# Patient Record
Sex: Female | Born: 1970 | Race: White | Hispanic: No | Marital: Married | Smoking: Former smoker
Health system: Southern US, Community
[De-identification: ages and names within clinical notes are randomized; demographics above are authoritative.]

## PROBLEM LIST (undated history)

## (undated) DIAGNOSIS — T8859XA Other complications of anesthesia, initial encounter: Secondary | ICD-10-CM

## (undated) DIAGNOSIS — F32A Depression, unspecified: Secondary | ICD-10-CM

## (undated) DIAGNOSIS — F419 Anxiety disorder, unspecified: Secondary | ICD-10-CM

## (undated) DIAGNOSIS — C50919 Malignant neoplasm of unspecified site of unspecified female breast: Secondary | ICD-10-CM

## (undated) DIAGNOSIS — J45909 Unspecified asthma, uncomplicated: Secondary | ICD-10-CM

## (undated) DIAGNOSIS — Z9889 Other specified postprocedural states: Secondary | ICD-10-CM

## (undated) DIAGNOSIS — E079 Disorder of thyroid, unspecified: Secondary | ICD-10-CM

## (undated) HISTORY — DX: Anxiety disorder, unspecified: F41.9

## (undated) HISTORY — DX: Unspecified asthma, uncomplicated: J45.909

## (undated) HISTORY — PX: ECTOPIC PREGNANCY SURGERY: SHX613

## (undated) HISTORY — DX: Depression, unspecified: F32.A

---

## 1998-02-07 ENCOUNTER — Other Ambulatory Visit: Admission: RE | Admit: 1998-02-07 | Discharge: 1998-02-07 | Payer: Self-pay | Admitting: Obstetrics and Gynecology

## 1999-02-12 ENCOUNTER — Other Ambulatory Visit: Admission: RE | Admit: 1999-02-12 | Discharge: 1999-02-12 | Payer: Self-pay | Admitting: *Deleted

## 1999-05-03 ENCOUNTER — Inpatient Hospital Stay (HOSPITAL_COMMUNITY): Admission: AD | Admit: 1999-05-03 | Discharge: 1999-05-03 | Payer: Self-pay | Admitting: Obstetrics & Gynecology

## 1999-05-09 ENCOUNTER — Inpatient Hospital Stay (HOSPITAL_COMMUNITY): Admission: AD | Admit: 1999-05-09 | Discharge: 1999-05-09 | Payer: Self-pay | Admitting: Obstetrics and Gynecology

## 1999-05-28 ENCOUNTER — Observation Stay (HOSPITAL_COMMUNITY): Admission: AD | Admit: 1999-05-28 | Discharge: 1999-05-29 | Payer: Self-pay | Admitting: *Deleted

## 2000-03-19 ENCOUNTER — Other Ambulatory Visit: Admission: RE | Admit: 2000-03-19 | Discharge: 2000-03-19 | Payer: Self-pay | Admitting: Obstetrics & Gynecology

## 2004-05-14 ENCOUNTER — Ambulatory Visit (HOSPITAL_COMMUNITY): Admission: RE | Admit: 2004-05-14 | Discharge: 2004-05-14 | Payer: Self-pay | Admitting: Obstetrics and Gynecology

## 2007-09-24 HISTORY — PX: CHOLECYSTECTOMY: SHX55

## 2015-02-15 ENCOUNTER — Other Ambulatory Visit: Payer: Self-pay

## 2015-02-15 DIAGNOSIS — Z1231 Encounter for screening mammogram for malignant neoplasm of breast: Secondary | ICD-10-CM

## 2015-02-22 ENCOUNTER — Ambulatory Visit
Admission: RE | Admit: 2015-02-22 | Discharge: 2015-02-22 | Disposition: A | Discharge status: Home / Self Care | Payer: PRIVATE HEALTH INSURANCE | Source: Ambulatory Visit

## 2015-02-22 DIAGNOSIS — Z1231 Encounter for screening mammogram for malignant neoplasm of breast: Secondary | ICD-10-CM

## 2015-06-22 ENCOUNTER — Ambulatory Visit (INDEPENDENT_AMBULATORY_CARE_PROVIDER_SITE_OTHER): Payer: 59 | Admitting: Allergy and Immunology

## 2015-06-22 ENCOUNTER — Encounter (INDEPENDENT_AMBULATORY_CARE_PROVIDER_SITE_OTHER): Payer: Self-pay

## 2015-06-22 ENCOUNTER — Encounter: Payer: Self-pay | Admitting: Allergy and Immunology

## 2015-06-22 VITALS — BP 108/80 | HR 88 | Resp 24 | Ht 66.0 in | Wt 186.1 lb

## 2015-06-22 DIAGNOSIS — J3089 Other allergic rhinitis: Secondary | ICD-10-CM | POA: Diagnosis not present

## 2015-06-22 DIAGNOSIS — K219 Gastro-esophageal reflux disease without esophagitis: Secondary | ICD-10-CM | POA: Insufficient documentation

## 2015-06-22 DIAGNOSIS — J387 Other diseases of larynx: Secondary | ICD-10-CM | POA: Diagnosis not present

## 2015-06-22 DIAGNOSIS — J4540 Moderate persistent asthma, uncomplicated: Secondary | ICD-10-CM

## 2015-06-22 MED ORDER — AZELASTINE-FLUTICASONE 137-50 MCG/ACT NA SUSP: 1.0000 | Freq: Two times a day (BID) | NASAL | Status: DC

## 2015-06-22 NOTE — Patient Instructions (Addendum)
  1. Start immunotherapy  2. Script for Dymista - one spray each nostril twice a day  3. Continue all other medications  4. Get a flu vaccine  5. Return in six months.

## 2015-06-22 NOTE — Progress Notes (Signed)
Subjective  Kristina [name] is a 44 y.o. female who [facility] of the following:  Problem  Moderate Persistent Asthma   She has been doing very well with her asthma without a requirement for a SABA or the need to get a steroid. However she did have a flare of rhinitis this month but once again had stable asthma status during this rhinitic flare. She continues on Asmanex consistently.   Other Allergic Rhinitis   Was doing very well until two weeks ago at which time she developed sneezing, nasal congestion, and watery eyes requiring her to go to the urgent care center and get a kenalog injection. She is much better today. This episode occurred wjile using her montelukast and patanase daily. She was given a sample of Dymista by the UC.    Laryngopharyngeal Reflux (Lpr)   While using a combination of omeprazole in the morning and ranitidine in the evening she has not had any reflux symptoms and she has not had any throat problems     Past Medical History  Diagnosis Date  . Asthma     Past Surgical History  Procedure Laterality Date  . Cholecystectomy  2009  . Ectopic pregnancy surgery      Current Outpatient Prescriptions on File Prior to Visit  Medication Sig Dispense Refill  . Albuterol Sulfate (PROAIR HFA IN) Inhale 2 puffs into the lungs every 4 (four) hours as needed.    Mack Guise THYROID PO Take 1 tablet by mouth daily.    . Ferrous Sulfate (IRON SUPPLEMENT PO) Take 1 tablet by mouth daily.    . mometasone (ASMANEX) 220 MCG/INH inhaler Inhale 2 puffs into the lungs daily.    . montelukast (SINGULAIR) 10 MG tablet Take 10 mg by mouth at bedtime.    . Olopatadine HCl (PATANASE) 0.6 % SOLN Place 1 puff into the nose 2 (two) times daily.    Marland Kitchen omeprazole (PRILOSEC) 40 MG capsule Take 40 mg by mouth daily.    . ranitidine (ZANTAC) 300 MG capsule Take 300 mg by mouth every evening.     No current facility-administered medications on file  prior to visit.    Meds ordered this encounter  Medications  . DISCONTD: Azelastine-Fluticasone (DYMISTA) 137-50 MCG/ACT SUSP    Sig: Place 1 puff into the nose 2 (two) times daily.  . Azelastine-Fluticasone (DYMISTA) 137-50 MCG/ACT SUSP    Sig: Place 1 puff into the nose 2 (two) times daily.    Dispense:  1 Bottle    Refill:  5      No Known Allergies  Review of Systems  Constitutional: Negative for fever and chills.  HENT: Negative for congestion, ear pain, hearing loss, nosebleeds, sore throat and tinnitus.   Eyes: Negative for redness.  Respiratory: Negative for cough, sputum production, shortness of breath and wheezing.   Cardiovascular: Negative for leg swelling.  Gastrointestinal: Negative for heartburn, nausea, vomiting and abdominal pain.  Skin: Negative for itching and rash.  Neurological: Negative for headaches.     Objective:   Filed Vitals:   06/22/15 1600  BP: 108/80  Pulse: 88  Resp: 24    Physical Exam  Constitutional: She is oriented to person, place, and time and well-developed, well-nourished, and in no distress.  HENT:  Right Ear: External ear normal.  Left Ear: External ear normal.  Nose: Nose normal.  Mouth/Throat: Oropharynx is clear and moist.  Eyes: Conjunctivae are normal.  Neck: No JVD  present. No tracheal deviation present. No thyromegaly present.  Cardiovascular: Normal rate, regular rhythm and normal heart sounds.  Exam reveals no gallop and no friction rub.   No murmur heard. Pulmonary/Chest: No stridor. No respiratory distress. She has no wheezes. She has no rales. She exhibits no tenderness.  Musculoskeletal: She exhibits no edema.  Lymphadenopathy:    She has no cervical adenopathy.  Neurological: She is alert and oriented to person, place, and time.  Skin: No rash noted. No erythema. No pallor.    Diagnostics:    Spirometry was performed and demonstrated an FEV1 of 2.75 @ 96% predicted.  The patient had an Asthma Control  Test with the following results: ACT Total Score: 24.    Assessment and Plan:     Problem List Items Addressed This Visit      Respiratory   Moderate persistent asthma - Primary   Relevant Orders   Spirometry with Graph   Other allergic rhinitis   Laryngopharyngeal reflux (LPR)     1. Start immunotherapy  2. Script for Dymista - one spray each nostril twice a day  3. Continue all other medications  4. Get a flu vaccine  5. Return in six months.

## 2015-06-28 DIAGNOSIS — J3081 Allergic rhinitis due to animal (cat) (dog) hair and dander: Secondary | ICD-10-CM | POA: Diagnosis not present

## 2015-06-29 DIAGNOSIS — J301 Allergic rhinitis due to pollen: Secondary | ICD-10-CM | POA: Diagnosis not present

## 2015-06-30 ENCOUNTER — Other Ambulatory Visit: Payer: Self-pay | Admitting: Allergy and Immunology

## 2015-06-30 DIAGNOSIS — J3089 Other allergic rhinitis: Secondary | ICD-10-CM | POA: Diagnosis not present

## 2015-06-30 DIAGNOSIS — J302 Other seasonal allergic rhinitis: Principal | ICD-10-CM

## 2015-06-30 DIAGNOSIS — H1010 Acute atopic conjunctivitis, unspecified eye: Secondary | ICD-10-CM

## 2015-07-06 ENCOUNTER — Other Ambulatory Visit: Payer: Self-pay | Admitting: *Deleted

## 2015-07-06 ENCOUNTER — Ambulatory Visit (INDEPENDENT_AMBULATORY_CARE_PROVIDER_SITE_OTHER): Payer: 59 | Admitting: *Deleted

## 2015-07-06 DIAGNOSIS — J309 Allergic rhinitis, unspecified: Secondary | ICD-10-CM

## 2015-07-06 MED ORDER — EPINEPHRINE 0.3 MG/0.3ML IJ SOAJ: 0.3000 mg | Freq: Once | INTRAMUSCULAR | Status: DC

## 2015-07-06 NOTE — Progress Notes (Signed)
Pt started inj - Grass/Tree - Dust Mite/Weed - Cat/Dog - Blue 1:100,000 at 0.05 schedule B 1-2 times weekly during build-up. Instructions/education on possible reactions reviewed. Consent signed. Epipen instructions given and RX sent to pharmacy.

## 2015-07-10 ENCOUNTER — Ambulatory Visit (INDEPENDENT_AMBULATORY_CARE_PROVIDER_SITE_OTHER): Payer: 59 | Admitting: *Deleted

## 2015-07-10 DIAGNOSIS — J309 Allergic rhinitis, unspecified: Secondary | ICD-10-CM

## 2015-07-13 ENCOUNTER — Ambulatory Visit (INDEPENDENT_AMBULATORY_CARE_PROVIDER_SITE_OTHER): Payer: 59 | Admitting: *Deleted

## 2015-07-13 DIAGNOSIS — J309 Allergic rhinitis, unspecified: Secondary | ICD-10-CM | POA: Diagnosis not present

## 2015-07-17 ENCOUNTER — Ambulatory Visit (INDEPENDENT_AMBULATORY_CARE_PROVIDER_SITE_OTHER): Payer: 59 | Admitting: *Deleted

## 2015-07-17 DIAGNOSIS — J3089 Other allergic rhinitis: Secondary | ICD-10-CM | POA: Diagnosis not present

## 2015-07-21 ENCOUNTER — Ambulatory Visit (INDEPENDENT_AMBULATORY_CARE_PROVIDER_SITE_OTHER): Payer: 59 | Admitting: *Deleted

## 2015-07-21 DIAGNOSIS — J309 Allergic rhinitis, unspecified: Secondary | ICD-10-CM

## 2015-07-24 ENCOUNTER — Ambulatory Visit (INDEPENDENT_AMBULATORY_CARE_PROVIDER_SITE_OTHER): Payer: 59 | Admitting: *Deleted

## 2015-07-24 DIAGNOSIS — J309 Allergic rhinitis, unspecified: Secondary | ICD-10-CM | POA: Diagnosis not present

## 2015-07-27 ENCOUNTER — Ambulatory Visit (INDEPENDENT_AMBULATORY_CARE_PROVIDER_SITE_OTHER): Payer: 59 | Admitting: *Deleted

## 2015-07-27 DIAGNOSIS — J309 Allergic rhinitis, unspecified: Secondary | ICD-10-CM

## 2015-07-31 ENCOUNTER — Ambulatory Visit (INDEPENDENT_AMBULATORY_CARE_PROVIDER_SITE_OTHER): Payer: 59

## 2015-07-31 DIAGNOSIS — J3089 Other allergic rhinitis: Secondary | ICD-10-CM

## 2015-08-03 ENCOUNTER — Ambulatory Visit (INDEPENDENT_AMBULATORY_CARE_PROVIDER_SITE_OTHER): Payer: 59

## 2015-08-03 DIAGNOSIS — J309 Allergic rhinitis, unspecified: Secondary | ICD-10-CM | POA: Diagnosis not present

## 2015-08-07 ENCOUNTER — Ambulatory Visit (INDEPENDENT_AMBULATORY_CARE_PROVIDER_SITE_OTHER): Payer: 59 | Admitting: *Deleted

## 2015-08-07 DIAGNOSIS — J309 Allergic rhinitis, unspecified: Secondary | ICD-10-CM

## 2015-08-10 ENCOUNTER — Ambulatory Visit (INDEPENDENT_AMBULATORY_CARE_PROVIDER_SITE_OTHER): Payer: 59

## 2015-08-10 DIAGNOSIS — J3089 Other allergic rhinitis: Secondary | ICD-10-CM | POA: Diagnosis not present

## 2015-08-14 ENCOUNTER — Ambulatory Visit (INDEPENDENT_AMBULATORY_CARE_PROVIDER_SITE_OTHER): Payer: 59 | Admitting: *Deleted

## 2015-08-14 DIAGNOSIS — J309 Allergic rhinitis, unspecified: Secondary | ICD-10-CM

## 2015-08-21 ENCOUNTER — Ambulatory Visit (INDEPENDENT_AMBULATORY_CARE_PROVIDER_SITE_OTHER): Payer: 59

## 2015-08-21 DIAGNOSIS — J309 Allergic rhinitis, unspecified: Secondary | ICD-10-CM

## 2015-08-24 ENCOUNTER — Ambulatory Visit (INDEPENDENT_AMBULATORY_CARE_PROVIDER_SITE_OTHER): Payer: 59 | Admitting: *Deleted

## 2015-08-24 DIAGNOSIS — J309 Allergic rhinitis, unspecified: Secondary | ICD-10-CM | POA: Diagnosis not present

## 2015-08-28 ENCOUNTER — Ambulatory Visit (INDEPENDENT_AMBULATORY_CARE_PROVIDER_SITE_OTHER): Payer: 59 | Admitting: *Deleted

## 2015-08-28 DIAGNOSIS — J309 Allergic rhinitis, unspecified: Secondary | ICD-10-CM

## 2015-08-31 ENCOUNTER — Ambulatory Visit (INDEPENDENT_AMBULATORY_CARE_PROVIDER_SITE_OTHER): Payer: 59 | Admitting: *Deleted

## 2015-08-31 DIAGNOSIS — J309 Allergic rhinitis, unspecified: Secondary | ICD-10-CM | POA: Diagnosis not present

## 2015-09-04 ENCOUNTER — Ambulatory Visit (INDEPENDENT_AMBULATORY_CARE_PROVIDER_SITE_OTHER): Payer: 59 | Admitting: *Deleted

## 2015-09-04 DIAGNOSIS — J309 Allergic rhinitis, unspecified: Secondary | ICD-10-CM

## 2015-09-08 ENCOUNTER — Ambulatory Visit (INDEPENDENT_AMBULATORY_CARE_PROVIDER_SITE_OTHER): Payer: 59 | Admitting: *Deleted

## 2015-09-08 DIAGNOSIS — J309 Allergic rhinitis, unspecified: Secondary | ICD-10-CM

## 2015-09-11 ENCOUNTER — Ambulatory Visit (INDEPENDENT_AMBULATORY_CARE_PROVIDER_SITE_OTHER): Payer: 59 | Admitting: *Deleted

## 2015-09-11 DIAGNOSIS — J309 Allergic rhinitis, unspecified: Secondary | ICD-10-CM

## 2015-09-28 ENCOUNTER — Ambulatory Visit (INDEPENDENT_AMBULATORY_CARE_PROVIDER_SITE_OTHER): Payer: 59 | Admitting: *Deleted

## 2015-09-28 DIAGNOSIS — J309 Allergic rhinitis, unspecified: Secondary | ICD-10-CM | POA: Diagnosis not present

## 2015-10-05 ENCOUNTER — Ambulatory Visit (INDEPENDENT_AMBULATORY_CARE_PROVIDER_SITE_OTHER): Payer: 59 | Admitting: *Deleted

## 2015-10-05 DIAGNOSIS — J309 Allergic rhinitis, unspecified: Secondary | ICD-10-CM | POA: Diagnosis not present

## 2015-10-12 ENCOUNTER — Ambulatory Visit (INDEPENDENT_AMBULATORY_CARE_PROVIDER_SITE_OTHER): Payer: 59

## 2015-10-12 DIAGNOSIS — J309 Allergic rhinitis, unspecified: Secondary | ICD-10-CM

## 2015-10-19 ENCOUNTER — Ambulatory Visit (INDEPENDENT_AMBULATORY_CARE_PROVIDER_SITE_OTHER): Payer: 59

## 2015-10-19 DIAGNOSIS — J309 Allergic rhinitis, unspecified: Secondary | ICD-10-CM

## 2015-10-26 ENCOUNTER — Ambulatory Visit (INDEPENDENT_AMBULATORY_CARE_PROVIDER_SITE_OTHER): Payer: 59

## 2015-10-26 DIAGNOSIS — J309 Allergic rhinitis, unspecified: Secondary | ICD-10-CM | POA: Diagnosis not present

## 2015-11-09 ENCOUNTER — Ambulatory Visit (INDEPENDENT_AMBULATORY_CARE_PROVIDER_SITE_OTHER): Payer: Managed Care, Other (non HMO) | Admitting: *Deleted

## 2015-11-09 DIAGNOSIS — J309 Allergic rhinitis, unspecified: Secondary | ICD-10-CM

## 2015-11-14 DIAGNOSIS — J301 Allergic rhinitis due to pollen: Secondary | ICD-10-CM | POA: Diagnosis not present

## 2015-11-15 DIAGNOSIS — J3081 Allergic rhinitis due to animal (cat) (dog) hair and dander: Secondary | ICD-10-CM | POA: Diagnosis not present

## 2015-11-16 ENCOUNTER — Other Ambulatory Visit: Payer: Self-pay | Admitting: Allergy and Immunology

## 2015-11-17 ENCOUNTER — Ambulatory Visit (INDEPENDENT_AMBULATORY_CARE_PROVIDER_SITE_OTHER): Payer: PRIVATE HEALTH INSURANCE | Admitting: *Deleted

## 2015-11-17 DIAGNOSIS — J309 Allergic rhinitis, unspecified: Secondary | ICD-10-CM

## 2015-11-23 ENCOUNTER — Ambulatory Visit (INDEPENDENT_AMBULATORY_CARE_PROVIDER_SITE_OTHER): Payer: Managed Care, Other (non HMO) | Admitting: *Deleted

## 2015-11-23 DIAGNOSIS — J309 Allergic rhinitis, unspecified: Secondary | ICD-10-CM | POA: Diagnosis not present

## 2015-11-30 ENCOUNTER — Ambulatory Visit (INDEPENDENT_AMBULATORY_CARE_PROVIDER_SITE_OTHER): Payer: Managed Care, Other (non HMO) | Admitting: *Deleted

## 2015-11-30 DIAGNOSIS — J309 Allergic rhinitis, unspecified: Secondary | ICD-10-CM | POA: Diagnosis not present

## 2015-12-08 ENCOUNTER — Ambulatory Visit (INDEPENDENT_AMBULATORY_CARE_PROVIDER_SITE_OTHER): Payer: Managed Care, Other (non HMO) | Admitting: *Deleted

## 2015-12-08 DIAGNOSIS — J309 Allergic rhinitis, unspecified: Secondary | ICD-10-CM

## 2015-12-15 ENCOUNTER — Ambulatory Visit (INDEPENDENT_AMBULATORY_CARE_PROVIDER_SITE_OTHER): Payer: Managed Care, Other (non HMO) | Admitting: *Deleted

## 2015-12-15 DIAGNOSIS — J309 Allergic rhinitis, unspecified: Secondary | ICD-10-CM | POA: Diagnosis not present

## 2015-12-20 ENCOUNTER — Ambulatory Visit: Payer: 59 | Admitting: Allergy and Immunology

## 2015-12-22 ENCOUNTER — Ambulatory Visit (INDEPENDENT_AMBULATORY_CARE_PROVIDER_SITE_OTHER): Payer: Managed Care, Other (non HMO) | Admitting: *Deleted

## 2015-12-22 DIAGNOSIS — J309 Allergic rhinitis, unspecified: Secondary | ICD-10-CM | POA: Diagnosis not present

## 2015-12-28 ENCOUNTER — Ambulatory Visit (INDEPENDENT_AMBULATORY_CARE_PROVIDER_SITE_OTHER): Payer: Managed Care, Other (non HMO) | Admitting: *Deleted

## 2015-12-28 DIAGNOSIS — J309 Allergic rhinitis, unspecified: Secondary | ICD-10-CM | POA: Diagnosis not present

## 2016-01-04 ENCOUNTER — Encounter: Payer: Self-pay | Admitting: Allergy and Immunology

## 2016-01-04 ENCOUNTER — Ambulatory Visit (INDEPENDENT_AMBULATORY_CARE_PROVIDER_SITE_OTHER): Payer: Managed Care, Other (non HMO) | Admitting: Allergy and Immunology

## 2016-01-04 VITALS — BP 128/80 | HR 80 | Resp 18

## 2016-01-04 DIAGNOSIS — J4540 Moderate persistent asthma, uncomplicated: Secondary | ICD-10-CM | POA: Diagnosis not present

## 2016-01-04 DIAGNOSIS — J309 Allergic rhinitis, unspecified: Secondary | ICD-10-CM

## 2016-01-04 DIAGNOSIS — J387 Other diseases of larynx: Secondary | ICD-10-CM

## 2016-01-04 DIAGNOSIS — J3089 Other allergic rhinitis: Secondary | ICD-10-CM

## 2016-01-04 DIAGNOSIS — K219 Gastro-esophageal reflux disease without esophagitis: Secondary | ICD-10-CM

## 2016-01-04 MED ORDER — MOMETASONE FUROATE 220 MCG/INH IN AEPB
INHALATION_SPRAY | RESPIRATORY_TRACT | Status: DC
Start: 2016-01-04 — End: 2016-01-04

## 2016-01-04 MED ORDER — MOMETASONE FUROATE 220 MCG/INH IN AEPB: INHALATION_SPRAY | RESPIRATORY_TRACT | Status: DC

## 2016-01-04 MED ORDER — MONTELUKAST SODIUM 10 MG PO TABS: 10.0000 mg | ORAL_TABLET | Freq: Every day | ORAL | Status: DC

## 2016-01-04 MED ORDER — FLUTICASONE PROPIONATE 50 MCG/ACT NA SUSP: NASAL | Status: DC

## 2016-01-04 MED ORDER — AZELASTINE HCL 0.1 % NA SOLN: NASAL | Status: DC

## 2016-01-04 MED ORDER — AZELASTINE-FLUTICASONE 137-50 MCG/ACT NA SUSP
1.0000 | Freq: Two times a day (BID) | NASAL | Status: DC
Start: 2016-01-04 — End: 2016-01-04

## 2016-01-04 NOTE — Patient Instructions (Signed)
  1. Continue immunotherapy and EpiPen  2. Treat and prevent inflammation:   A. Asmanex 220 - one inhalation 1 time per day.  B. montelukast 10 mg - one tablet once a day  C. Dymista one spray each nostril twice a day  3. Continue omeprazole 40 mg in the morning  4. Can add ranitidine 300 mg in the evening if needed  5. Can use ProAir HFA 2 puffs every 4-6 hours if needed  6. Can add OTC antihistamine if needed  7. "Action plan" for asthma flare up: Increase Asmanex 220 to 2 inhalations twice a day and use ProAir HFA if needed  8. Return to clinic in 6 months or earlier if problem

## 2016-01-04 NOTE — Progress Notes (Signed)
Follow-up Note  Referring Provider: Philemon [name], Caroline, MD Primary Provider: Philemon [name],CAROLINE, MD Date of Office Visit: 01/04/2016  Subjective:   Kristina [name] (DOB: 02/19/1971) is a 45 y.o. female who [facility] in re-evaluation of the following:  HPI Comments: Kristina HerterShannon returns to this clinic in reevaluation of her moderate persistent asthma, allergic rhinitis, and LPR. She's been doing quite well but she developed a little bit of cough since the spring has arrived for which she restarted her Asmanex at a dose of one inhalation 1 time per day and has had improvement. She does not use her bronchodilator more than twice a week at this point. She is not had any problems with her nose while using her immunotherapy and montelukast and nasal steroid and antihistamine. She's not been having any problems with reflux while using her omeprazole in the morning and she no longer uses any ranitidine. He did not obtain the flu vaccine this fall.     Medication List           ARMOUR THYROID PO  Take 1 tablet by mouth daily.     ASMANEX 60 METERED DOSES 220 MCG/INH inhaler  Generic drug:  mometasone  INHALE TWO PUFFS BY MOUTH ONCE DAILY TO PREVENT COUGH OR WHEEZE.  RINSE, GARGLE, AND SPIT AFTER USE.     Azelastine-Fluticasone 137-50 MCG/ACT Susp  Commonly known as:  DYMISTA  Place 1 puff into the nose 2 (two) times daily.     EPINEPHrine 0.3 mg/0.3 mL Soaj injection  Commonly known as:  EPIPEN 2-PAK  Inject 0.3 mLs (0.3 mg total) into the muscle once.     IRON SUPPLEMENT PO  Take 1 tablet by mouth daily. Reported on 01/04/2016     montelukast 10 MG tablet  Commonly known as:  SINGULAIR  Take 10 mg by mouth at bedtime. Reported on 01/04/2016     omeprazole 40 MG capsule  Commonly known as:  PRILOSEC  Take 40 mg by mouth as needed.     PATANASE 0.6 % Soln  Generic drug:  Olopatadine HCl  Place 1 puff into the nose 2 (two) times daily.  Reported on 01/04/2016     PROAIR HFA IN  Inhale 2 puffs into the lungs every 4 (four) hours as needed.     ranitidine 300 MG capsule  Commonly known as:  ZANTAC  Take 300 mg by mouth every evening. Reported on 01/04/2016        Past Medical History  Diagnosis Date  . Asthma     Past Surgical History  Procedure Laterality Date  . Cholecystectomy  2009  . Ectopic pregnancy surgery      No Known Allergies  Review of systems negative except as noted in HPI / PMHx or noted below:  Review of Systems  Constitutional: Negative.   HENT: Negative.   Eyes: Negative.   Respiratory: Negative.   Cardiovascular: Negative.   Gastrointestinal: Negative.   Genitourinary: Negative.   Musculoskeletal: Negative.   Skin: Negative.   Neurological: Negative.   Endo/Heme/Allergies: Negative.   Psychiatric/Behavioral: Negative.      Objective:   Filed Vitals:   01/04/16 1020  BP: 128/80  Pulse: 80  Resp: 18          Physical Exam  Constitutional: She is well-developed, well-nourished, and in no distress.  HENT:  Head: Normocephalic.  Right Ear: Tympanic membrane, external ear and ear canal normal.  Left Ear: Tympanic membrane,  external ear and ear canal normal.  Nose: Nose normal. No mucosal edema or rhinorrhea.  Mouth/Throat: Uvula is midline, oropharynx is clear and moist and mucous membranes are normal. No oropharyngeal exudate.  Eyes: Conjunctivae are normal.  Neck: Trachea normal. No tracheal tenderness present. No tracheal deviation present. No thyromegaly present.  Cardiovascular: Normal rate, regular rhythm, S1 normal, S2 normal and normal heart sounds.   No murmur heard. Pulmonary/Chest: Breath sounds normal. No stridor. No respiratory distress. She has no wheezes. She has no rales.  Musculoskeletal: She exhibits no edema.  Lymphadenopathy:       Head (right side): No tonsillar adenopathy present.       Head (left side): No tonsillar adenopathy present.    She has  no cervical adenopathy.  Neurological: She is alert. Gait normal.  Skin: No rash noted. She is not diaphoretic. No erythema. Nails show no clubbing.  Psychiatric: Mood and affect normal.    Diagnostics:    Spirometry was performed and demonstrated an FEV1 of 2.53 at 82 % of predicted.  The patient had an Asthma Control Test with the following results:  .    Assessment and Plan:   1. Allergic rhinitis, unspecified allergic rhinitis type   2. Moderate persistent asthma, uncomplicated   3. Laryngopharyngeal reflux (LPR)   4. Other allergic rhinitis     1. Continue immunotherapy and EpiPen  2. Treat and prevent inflammation:   A. Asmanex 220 - one inhalation 1 time per day.  B. montelukast 10 mg - one tablet once a day  C. Dymista one spray each nostril twice a day  3. Continue omeprazole 40 mg in the morning  4. Can add ranitidine 300 mg in the evening if needed  5. Can use ProAir HFA 2 puffs every 4-6 hours if needed  6. Can add OTC antihistamine if needed  7. "Action plan" for asthma flare up: Increase Asmanex 220 to 2 inhalations twice a day and use ProAir HFA if needed  8. Return to clinic in 6 months or earlier if problem  Kristina [name] appears to be doing relatively well and we will keep her on the therapy mentioned above and see her back in this clinic in 6 months or earlier if there is a problem.   Kristina Schimke, MD [facility]

## 2016-01-08 ENCOUNTER — Telehealth: Payer: Self-pay | Admitting: *Deleted

## 2016-01-08 NOTE — Telephone Encounter (Signed)
CALLED PATIENT AND ADVISED DYMISTA NOT COVERED AND WE HAD SENT RX AZELASTINE AND FLUTICASONE TO [facility] TO REPLACE SAME

## 2016-01-11 ENCOUNTER — Ambulatory Visit (INDEPENDENT_AMBULATORY_CARE_PROVIDER_SITE_OTHER): Payer: Managed Care, Other (non HMO) | Admitting: *Deleted

## 2016-01-11 DIAGNOSIS — J309 Allergic rhinitis, unspecified: Secondary | ICD-10-CM

## 2016-01-19 ENCOUNTER — Ambulatory Visit (INDEPENDENT_AMBULATORY_CARE_PROVIDER_SITE_OTHER): Payer: Managed Care, Other (non HMO) | Admitting: *Deleted

## 2016-01-19 DIAGNOSIS — J309 Allergic rhinitis, unspecified: Secondary | ICD-10-CM

## 2016-01-26 ENCOUNTER — Ambulatory Visit (INDEPENDENT_AMBULATORY_CARE_PROVIDER_SITE_OTHER): Payer: Managed Care, Other (non HMO) | Admitting: *Deleted

## 2016-01-26 DIAGNOSIS — J309 Allergic rhinitis, unspecified: Secondary | ICD-10-CM | POA: Diagnosis not present

## 2016-02-02 ENCOUNTER — Ambulatory Visit (INDEPENDENT_AMBULATORY_CARE_PROVIDER_SITE_OTHER): Payer: Managed Care, Other (non HMO)

## 2016-02-02 DIAGNOSIS — J309 Allergic rhinitis, unspecified: Secondary | ICD-10-CM | POA: Diagnosis not present

## 2016-02-08 DIAGNOSIS — J301 Allergic rhinitis due to pollen: Secondary | ICD-10-CM | POA: Diagnosis not present

## 2016-02-09 DIAGNOSIS — J3089 Other allergic rhinitis: Secondary | ICD-10-CM | POA: Diagnosis not present

## 2016-02-12 ENCOUNTER — Ambulatory Visit (INDEPENDENT_AMBULATORY_CARE_PROVIDER_SITE_OTHER): Payer: Managed Care, Other (non HMO)

## 2016-02-12 DIAGNOSIS — J309 Allergic rhinitis, unspecified: Secondary | ICD-10-CM

## 2016-02-22 ENCOUNTER — Ambulatory Visit (INDEPENDENT_AMBULATORY_CARE_PROVIDER_SITE_OTHER): Payer: BLUE CROSS/BLUE SHIELD

## 2016-02-22 DIAGNOSIS — J309 Allergic rhinitis, unspecified: Secondary | ICD-10-CM | POA: Diagnosis not present

## 2016-03-04 ENCOUNTER — Ambulatory Visit (INDEPENDENT_AMBULATORY_CARE_PROVIDER_SITE_OTHER): Payer: BLUE CROSS/BLUE SHIELD | Admitting: *Deleted

## 2016-03-04 DIAGNOSIS — J309 Allergic rhinitis, unspecified: Secondary | ICD-10-CM

## 2016-03-21 ENCOUNTER — Ambulatory Visit (INDEPENDENT_AMBULATORY_CARE_PROVIDER_SITE_OTHER): Payer: BLUE CROSS/BLUE SHIELD | Admitting: *Deleted

## 2016-03-21 DIAGNOSIS — J309 Allergic rhinitis, unspecified: Secondary | ICD-10-CM | POA: Diagnosis not present

## 2016-03-28 ENCOUNTER — Ambulatory Visit (INDEPENDENT_AMBULATORY_CARE_PROVIDER_SITE_OTHER): Payer: BLUE CROSS/BLUE SHIELD | Admitting: *Deleted

## 2016-03-28 DIAGNOSIS — J309 Allergic rhinitis, unspecified: Secondary | ICD-10-CM | POA: Diagnosis not present

## 2016-04-04 ENCOUNTER — Ambulatory Visit (INDEPENDENT_AMBULATORY_CARE_PROVIDER_SITE_OTHER): Payer: BLUE CROSS/BLUE SHIELD | Admitting: *Deleted

## 2016-04-04 DIAGNOSIS — J309 Allergic rhinitis, unspecified: Secondary | ICD-10-CM

## 2016-04-11 ENCOUNTER — Ambulatory Visit (INDEPENDENT_AMBULATORY_CARE_PROVIDER_SITE_OTHER): Payer: BLUE CROSS/BLUE SHIELD | Admitting: *Deleted

## 2016-04-11 DIAGNOSIS — J309 Allergic rhinitis, unspecified: Secondary | ICD-10-CM

## 2016-04-18 ENCOUNTER — Ambulatory Visit (INDEPENDENT_AMBULATORY_CARE_PROVIDER_SITE_OTHER): Payer: BLUE CROSS/BLUE SHIELD | Admitting: *Deleted

## 2016-04-18 DIAGNOSIS — J309 Allergic rhinitis, unspecified: Secondary | ICD-10-CM | POA: Diagnosis not present

## 2016-04-22 ENCOUNTER — Other Ambulatory Visit: Payer: Self-pay | Admitting: Allergy and Immunology

## 2016-04-29 ENCOUNTER — Ambulatory Visit (INDEPENDENT_AMBULATORY_CARE_PROVIDER_SITE_OTHER): Payer: BLUE CROSS/BLUE SHIELD | Admitting: *Deleted

## 2016-04-29 DIAGNOSIS — J309 Allergic rhinitis, unspecified: Secondary | ICD-10-CM

## 2016-04-30 DIAGNOSIS — J301 Allergic rhinitis due to pollen: Secondary | ICD-10-CM | POA: Diagnosis not present

## 2016-05-01 DIAGNOSIS — J3081 Allergic rhinitis due to animal (cat) (dog) hair and dander: Secondary | ICD-10-CM | POA: Diagnosis not present

## 2016-05-02 DIAGNOSIS — J3089 Other allergic rhinitis: Secondary | ICD-10-CM | POA: Diagnosis not present

## 2016-05-13 ENCOUNTER — Ambulatory Visit (INDEPENDENT_AMBULATORY_CARE_PROVIDER_SITE_OTHER): Payer: BLUE CROSS/BLUE SHIELD | Admitting: *Deleted

## 2016-05-13 DIAGNOSIS — J309 Allergic rhinitis, unspecified: Secondary | ICD-10-CM

## 2016-05-30 ENCOUNTER — Ambulatory Visit (INDEPENDENT_AMBULATORY_CARE_PROVIDER_SITE_OTHER): Payer: BLUE CROSS/BLUE SHIELD | Admitting: *Deleted

## 2016-05-30 DIAGNOSIS — J309 Allergic rhinitis, unspecified: Secondary | ICD-10-CM

## 2016-06-13 ENCOUNTER — Ambulatory Visit (INDEPENDENT_AMBULATORY_CARE_PROVIDER_SITE_OTHER): Payer: BLUE CROSS/BLUE SHIELD | Admitting: *Deleted

## 2016-06-13 DIAGNOSIS — J309 Allergic rhinitis, unspecified: Secondary | ICD-10-CM | POA: Diagnosis not present

## 2016-06-24 ENCOUNTER — Ambulatory Visit (INDEPENDENT_AMBULATORY_CARE_PROVIDER_SITE_OTHER): Payer: BLUE CROSS/BLUE SHIELD | Admitting: *Deleted

## 2016-06-24 DIAGNOSIS — J309 Allergic rhinitis, unspecified: Secondary | ICD-10-CM | POA: Diagnosis not present

## 2016-07-08 ENCOUNTER — Ambulatory Visit: Payer: Managed Care, Other (non HMO) | Admitting: Allergy and Immunology

## 2016-07-15 ENCOUNTER — Ambulatory Visit (INDEPENDENT_AMBULATORY_CARE_PROVIDER_SITE_OTHER): Payer: BLUE CROSS/BLUE SHIELD | Admitting: *Deleted

## 2016-07-15 DIAGNOSIS — J309 Allergic rhinitis, unspecified: Secondary | ICD-10-CM

## 2016-07-22 ENCOUNTER — Ambulatory Visit (INDEPENDENT_AMBULATORY_CARE_PROVIDER_SITE_OTHER): Payer: BLUE CROSS/BLUE SHIELD | Admitting: *Deleted

## 2016-07-22 DIAGNOSIS — J309 Allergic rhinitis, unspecified: Secondary | ICD-10-CM

## 2016-08-01 ENCOUNTER — Ambulatory Visit (INDEPENDENT_AMBULATORY_CARE_PROVIDER_SITE_OTHER): Payer: BLUE CROSS/BLUE SHIELD | Admitting: *Deleted

## 2016-08-01 DIAGNOSIS — J309 Allergic rhinitis, unspecified: Secondary | ICD-10-CM

## 2016-08-08 ENCOUNTER — Ambulatory Visit (INDEPENDENT_AMBULATORY_CARE_PROVIDER_SITE_OTHER): Payer: BLUE CROSS/BLUE SHIELD | Admitting: *Deleted

## 2016-08-08 DIAGNOSIS — J309 Allergic rhinitis, unspecified: Secondary | ICD-10-CM

## 2016-08-19 ENCOUNTER — Ambulatory Visit (INDEPENDENT_AMBULATORY_CARE_PROVIDER_SITE_OTHER): Payer: BLUE CROSS/BLUE SHIELD | Admitting: *Deleted

## 2016-08-19 DIAGNOSIS — J309 Allergic rhinitis, unspecified: Secondary | ICD-10-CM | POA: Diagnosis not present

## 2016-09-02 ENCOUNTER — Ambulatory Visit (INDEPENDENT_AMBULATORY_CARE_PROVIDER_SITE_OTHER): Payer: BLUE CROSS/BLUE SHIELD | Admitting: *Deleted

## 2016-09-02 DIAGNOSIS — J309 Allergic rhinitis, unspecified: Secondary | ICD-10-CM

## 2016-09-19 ENCOUNTER — Ambulatory Visit (INDEPENDENT_AMBULATORY_CARE_PROVIDER_SITE_OTHER): Payer: BLUE CROSS/BLUE SHIELD | Admitting: *Deleted

## 2016-09-19 DIAGNOSIS — J309 Allergic rhinitis, unspecified: Secondary | ICD-10-CM | POA: Diagnosis not present

## 2016-09-24 DIAGNOSIS — J301 Allergic rhinitis due to pollen: Secondary | ICD-10-CM | POA: Diagnosis not present

## 2016-09-25 DIAGNOSIS — J3089 Other allergic rhinitis: Secondary | ICD-10-CM | POA: Diagnosis not present

## 2016-10-02 NOTE — Addendum Note (Signed)
Addended by: Berna BueWHITAKER, Charlotta Lapaglia L on: 10/02/2016 09:50 AM   Modules accepted: Orders

## 2016-10-04 ENCOUNTER — Ambulatory Visit (INDEPENDENT_AMBULATORY_CARE_PROVIDER_SITE_OTHER): Payer: BLUE CROSS/BLUE SHIELD | Admitting: *Deleted

## 2016-10-04 DIAGNOSIS — J309 Allergic rhinitis, unspecified: Secondary | ICD-10-CM | POA: Diagnosis not present

## 2016-10-18 ENCOUNTER — Ambulatory Visit (INDEPENDENT_AMBULATORY_CARE_PROVIDER_SITE_OTHER): Payer: BLUE CROSS/BLUE SHIELD | Admitting: *Deleted

## 2016-10-18 DIAGNOSIS — J309 Allergic rhinitis, unspecified: Secondary | ICD-10-CM | POA: Diagnosis not present

## 2016-11-01 ENCOUNTER — Ambulatory Visit (INDEPENDENT_AMBULATORY_CARE_PROVIDER_SITE_OTHER): Payer: BLUE CROSS/BLUE SHIELD | Admitting: *Deleted

## 2016-11-01 DIAGNOSIS — J309 Allergic rhinitis, unspecified: Secondary | ICD-10-CM

## 2016-11-15 ENCOUNTER — Ambulatory Visit (INDEPENDENT_AMBULATORY_CARE_PROVIDER_SITE_OTHER): Payer: BLUE CROSS/BLUE SHIELD

## 2016-11-15 DIAGNOSIS — J309 Allergic rhinitis, unspecified: Secondary | ICD-10-CM | POA: Diagnosis not present

## 2016-12-06 ENCOUNTER — Ambulatory Visit (INDEPENDENT_AMBULATORY_CARE_PROVIDER_SITE_OTHER): Payer: BLUE CROSS/BLUE SHIELD | Admitting: *Deleted

## 2016-12-06 DIAGNOSIS — J309 Allergic rhinitis, unspecified: Secondary | ICD-10-CM

## 2016-12-26 ENCOUNTER — Ambulatory Visit (INDEPENDENT_AMBULATORY_CARE_PROVIDER_SITE_OTHER): Payer: BLUE CROSS/BLUE SHIELD | Admitting: *Deleted

## 2016-12-26 DIAGNOSIS — J309 Allergic rhinitis, unspecified: Secondary | ICD-10-CM

## 2017-01-10 ENCOUNTER — Ambulatory Visit (INDEPENDENT_AMBULATORY_CARE_PROVIDER_SITE_OTHER): Payer: BLUE CROSS/BLUE SHIELD | Admitting: *Deleted

## 2017-01-10 DIAGNOSIS — J309 Allergic rhinitis, unspecified: Secondary | ICD-10-CM | POA: Diagnosis not present

## 2017-01-24 ENCOUNTER — Ambulatory Visit (INDEPENDENT_AMBULATORY_CARE_PROVIDER_SITE_OTHER): Payer: BLUE CROSS/BLUE SHIELD | Admitting: *Deleted

## 2017-01-24 DIAGNOSIS — J309 Allergic rhinitis, unspecified: Secondary | ICD-10-CM | POA: Diagnosis not present

## 2017-02-14 ENCOUNTER — Ambulatory Visit (INDEPENDENT_AMBULATORY_CARE_PROVIDER_SITE_OTHER): Payer: BLUE CROSS/BLUE SHIELD | Admitting: *Deleted

## 2017-02-14 DIAGNOSIS — J309 Allergic rhinitis, unspecified: Secondary | ICD-10-CM

## 2017-02-28 ENCOUNTER — Ambulatory Visit (INDEPENDENT_AMBULATORY_CARE_PROVIDER_SITE_OTHER): Payer: BLUE CROSS/BLUE SHIELD | Admitting: *Deleted

## 2017-02-28 DIAGNOSIS — J309 Allergic rhinitis, unspecified: Secondary | ICD-10-CM | POA: Diagnosis not present

## 2017-03-14 ENCOUNTER — Ambulatory Visit (INDEPENDENT_AMBULATORY_CARE_PROVIDER_SITE_OTHER): Payer: BLUE CROSS/BLUE SHIELD | Admitting: *Deleted

## 2017-03-14 DIAGNOSIS — J309 Allergic rhinitis, unspecified: Secondary | ICD-10-CM

## 2017-04-04 ENCOUNTER — Ambulatory Visit (INDEPENDENT_AMBULATORY_CARE_PROVIDER_SITE_OTHER): Payer: BLUE CROSS/BLUE SHIELD

## 2017-04-04 DIAGNOSIS — J309 Allergic rhinitis, unspecified: Secondary | ICD-10-CM

## 2017-04-15 DIAGNOSIS — J3089 Other allergic rhinitis: Secondary | ICD-10-CM | POA: Diagnosis not present

## 2017-04-15 NOTE — Progress Notes (Signed)
Vials exp 04-15-18 

## 2017-04-15 NOTE — Progress Notes (Signed)
Vials made 04-15-18

## 2017-04-25 ENCOUNTER — Ambulatory Visit (INDEPENDENT_AMBULATORY_CARE_PROVIDER_SITE_OTHER): Payer: BLUE CROSS/BLUE SHIELD | Admitting: *Deleted

## 2017-04-25 DIAGNOSIS — J309 Allergic rhinitis, unspecified: Secondary | ICD-10-CM

## 2017-05-19 ENCOUNTER — Ambulatory Visit (INDEPENDENT_AMBULATORY_CARE_PROVIDER_SITE_OTHER): Payer: BLUE CROSS/BLUE SHIELD | Admitting: *Deleted

## 2017-05-19 DIAGNOSIS — J309 Allergic rhinitis, unspecified: Secondary | ICD-10-CM

## 2017-06-09 ENCOUNTER — Ambulatory Visit (INDEPENDENT_AMBULATORY_CARE_PROVIDER_SITE_OTHER): Payer: BLUE CROSS/BLUE SHIELD | Admitting: *Deleted

## 2017-06-09 DIAGNOSIS — J309 Allergic rhinitis, unspecified: Secondary | ICD-10-CM

## 2017-06-30 ENCOUNTER — Ambulatory Visit (INDEPENDENT_AMBULATORY_CARE_PROVIDER_SITE_OTHER): Payer: BLUE CROSS/BLUE SHIELD | Admitting: *Deleted

## 2017-06-30 DIAGNOSIS — J309 Allergic rhinitis, unspecified: Secondary | ICD-10-CM

## 2017-07-17 ENCOUNTER — Ambulatory Visit (INDEPENDENT_AMBULATORY_CARE_PROVIDER_SITE_OTHER): Payer: BLUE CROSS/BLUE SHIELD | Admitting: *Deleted

## 2017-07-17 DIAGNOSIS — J309 Allergic rhinitis, unspecified: Secondary | ICD-10-CM

## 2017-08-08 ENCOUNTER — Ambulatory Visit (INDEPENDENT_AMBULATORY_CARE_PROVIDER_SITE_OTHER): Payer: BLUE CROSS/BLUE SHIELD | Admitting: *Deleted

## 2017-08-08 DIAGNOSIS — J309 Allergic rhinitis, unspecified: Secondary | ICD-10-CM

## 2017-08-22 ENCOUNTER — Ambulatory Visit (INDEPENDENT_AMBULATORY_CARE_PROVIDER_SITE_OTHER): Payer: BLUE CROSS/BLUE SHIELD | Admitting: *Deleted

## 2017-08-22 DIAGNOSIS — J309 Allergic rhinitis, unspecified: Secondary | ICD-10-CM | POA: Diagnosis not present

## 2017-09-05 ENCOUNTER — Ambulatory Visit (INDEPENDENT_AMBULATORY_CARE_PROVIDER_SITE_OTHER): Payer: BLUE CROSS/BLUE SHIELD | Admitting: *Deleted

## 2017-09-05 DIAGNOSIS — J309 Allergic rhinitis, unspecified: Secondary | ICD-10-CM | POA: Diagnosis not present

## 2017-09-05 MED ORDER — ALBUTEROL SULFATE 108 (90 BASE) MCG/ACT IN AEPB: 2.0000 | INHALATION_SPRAY | RESPIRATORY_TRACT | 0 refills | Status: AC | PRN

## 2017-09-25 ENCOUNTER — Encounter: Payer: Self-pay | Admitting: Allergy

## 2017-09-25 ENCOUNTER — Ambulatory Visit (INDEPENDENT_AMBULATORY_CARE_PROVIDER_SITE_OTHER): Payer: BLUE CROSS/BLUE SHIELD | Admitting: Allergy

## 2017-09-25 ENCOUNTER — Ambulatory Visit: Payer: Self-pay | Admitting: *Deleted

## 2017-09-25 VITALS — BP 128/92 | HR 76 | Resp 20

## 2017-09-25 DIAGNOSIS — J3089 Other allergic rhinitis: Secondary | ICD-10-CM

## 2017-09-25 DIAGNOSIS — J309 Allergic rhinitis, unspecified: Secondary | ICD-10-CM

## 2017-09-25 DIAGNOSIS — J4540 Moderate persistent asthma, uncomplicated: Secondary | ICD-10-CM

## 2017-09-25 MED ORDER — MOMETASONE FUROATE 100 MCG/ACT IN AERO: 2.0000 | INHALATION_SPRAY | Freq: Two times a day (BID) | RESPIRATORY_TRACT | 3 refills | Status: DC

## 2017-09-25 MED ORDER — OLOPATADINE HCL 0.7 % OP SOLN: 1.0000 [drp] | OPHTHALMIC | 5 refills | Status: DC

## 2017-09-25 NOTE — Patient Instructions (Addendum)
  1. Continue immunotherapy and EpiPen  2. Treat and prevent inflammation:   A.  Start Asmanex 100 - 2 puffs twice a day if you are not meeting the below asthma goals and take daily  3. Can use ProAir HFA 2 puffs every 4-6 hours if needed  4. Can add OTC antihistamine if needed (like Claritin, Zyrtec, Allegra or Xyzal)  5. Will prescribe Pazeo to use 1 drop each eye as needed daily for itchy/watery/red eyes  6. "Action plan" for asthma flare up: Increase Asmanex 100 to 3inhalations three times a day and use ProAir HFA if needed  7. Return to clinic in 6 months or earlier if problem   Asthma control goals:   Full participation in all desired activities (may need albuterol before activity)  Albuterol use two time or less a week on average (not counting use with activity)  Cough interfering with sleep two time or less a month  Oral steroids no more than once a year  No hospitalizations

## 2017-09-25 NOTE — Progress Notes (Signed)
Follow-up Note  RE: Kristina [name] MRN: [id number] DOB: Aug 28, 1971 Date of Office Visit: 09/25/2017   History of present illness: Kristina [name] is a 47 y.o. female presenting today for follow-up of allergic rhinitis, asthma and reflux.  She was last seen in the office on January 04, 2016 by Dr. Lucie Leather.  Since this visit she denies any major health changes, surgeries or hospitalizations.  She does feel that overall her allergies and asthma have been much improved over the past year however she noticed this fall when she went to turn on the heat for the fall and wintertime that she developed a cough.  She still has some occasional coughing.  She states she uses her albuterol on average once a week or every other week.  She denies any nighttime awakenings.  She denies any ED or urgent care visits or oral steroids since her last visit.  She does note that when she took a recent trip to [place] that she did need to use her albuterol due to the pollution and smog.  She states she stopped using her Asmanex as her asthma had improved.  She also stopped taking Singulair because her asthma had improved as well as her allergies. In regards to her allergies she states that she has been doing well with Claritin.  She will occasionally use a nasal spray.  She does report itchy eyes of which she would like to have a prescription eyedrop.  She states she has been using an over-the-counter drop but is unsure if it is a antihistamine base drop.  She is on allergen immunotherapy and is at the red vial and is doing well on this.  She does have an epinephrine device. She denies having any heartburn related symptoms.  She no longer takes omeprazole or ranitidine.  Review of systems: Review of Systems  Constitutional: Negative for chills, fever and malaise/fatigue.  HENT: Negative for congestion, ear discharge, ear pain, nosebleeds, sinus pain and sore throat.   Eyes: Negative for pain, discharge and redness.    Respiratory: Positive for cough. Negative for sputum production, shortness of breath and wheezing.   Cardiovascular: Negative for chest pain.  Gastrointestinal: Negative for abdominal pain, constipation, diarrhea, heartburn, nausea and vomiting.  Musculoskeletal: Negative for joint pain and myalgias.  Skin: Negative for itching and rash.  Neurological: Negative for dizziness and headaches.    All other systems negative unless noted above in HPI  Past medical/social/surgical/family history have been reviewed and are unchanged unless specifically indicated below.  No changes  Medication List: Allergies as of 09/25/2017   No Known Allergies     Medication List        Accurate as of 09/25/17  1:27 PM. Always use your most recent med list.          Albuterol Sulfate 108 (90 Base) MCG/ACT Aepb Commonly known as:  PROAIR RESPICLICK Inhale 2 puffs into the lungs every 4 (four) hours as needed.   ARMOUR THYROID PO Take 1 tablet by mouth daily.   azelastine 0.1 % nasal spray Commonly known as:  ASTELIN USE ONE SPRAY IN EACH NOSTRIL TWICE DAILY   EPINEPHrine 0.3 mg/0.3 mL Soaj injection Commonly known as:  EPIPEN 2-PAK Inject 0.3 mLs (0.3 mg total) into the muscle once.   fluticasone 50 MCG/ACT nasal spray Commonly known as:  FLONASE USE ONE SPRAY IN EACH NOSTRIL TWICE DAILY   IRON SUPPLEMENT PO Take 1 tablet by mouth daily. Reported on 01/04/2016  loratadine 10 MG tablet Commonly known as:  CLARITIN Take 10 mg by mouth daily.       Known medication allergies: No Known Allergies   Physical examination: Blood pressure (!) 128/92, pulse 76, resp. rate 20.  General: Alert, interactive, in no acute distress. HEENT: PERRLA, TMs pearly gray, turbinates minimally edematous without discharge, post-pharynx unremarkable. Neck: Supple without lymphadenopathy. Lungs: Clear to auscultation without wheezing, rhonchi or rales. {no increased work of breathing. CV: Normal S1, S2  without murmurs. Abdomen: Nondistended, nontender. Skin: Warm and dry, without lesions or rashes. Extremities:  No clubbing, cyanosis or edema. Neuro:   Grossly intact.  Diagnositics/Labs:  Spirometry: FEV1: 2.93L  104%, FVC: 3.44L  99%, ratio consistent with Nonobstructive pattern   Assessment and plan:   Allergic rhinitis Moderate persistent asthma - asthma at this time is still well controlled.  I have advised her that if she is not meeting the below asthma goals to go ahead and restart her Asmanex for twice daily use.  1. Continue immunotherapy and EpiPen  2. Treat and prevent inflammation:   A.  Start Asmanex 100 - 2 puffs twice a day if you are not meeting the below asthma goals and take daily  3. Can use ProAir HFA 2 puffs every 4-6 hours if needed  4. Can add OTC antihistamine if needed (like Claritin, Zyrtec, Allegra or Xyzal)  5. Will prescribe Pazeo to use 1 drop each eye as needed daily for itchy/watery/red eyes  6. "Action plan" for asthma flare up: Increase Asmanex 100 to 3inhalations three times a day and use ProAir HFA if needed  7. Return to clinic in 6 months or earlier if problem   Asthma control goals:   Full participation in all desired activities (may need albuterol before activity)  Albuterol use two time or less a week on average (not counting use with activity)  Cough interfering with sleep two time or less a month  Oral steroids no more than once a year  No hospitalizations   I appreciate the opportunity to take part in Kristina [name]'s care. Please do not hesitate to contact me with questions.  Sincerely,   Margo AyeShaylar Dempsey Knotek, MD Allergy/Immunology [facility]

## 2017-10-09 ENCOUNTER — Ambulatory Visit (INDEPENDENT_AMBULATORY_CARE_PROVIDER_SITE_OTHER): Payer: BLUE CROSS/BLUE SHIELD

## 2017-10-09 DIAGNOSIS — J309 Allergic rhinitis, unspecified: Secondary | ICD-10-CM | POA: Diagnosis not present

## 2017-10-23 ENCOUNTER — Ambulatory Visit (INDEPENDENT_AMBULATORY_CARE_PROVIDER_SITE_OTHER): Payer: BLUE CROSS/BLUE SHIELD | Admitting: *Deleted

## 2017-10-23 DIAGNOSIS — J309 Allergic rhinitis, unspecified: Secondary | ICD-10-CM | POA: Diagnosis not present

## 2017-11-05 DIAGNOSIS — J3089 Other allergic rhinitis: Secondary | ICD-10-CM | POA: Diagnosis not present

## 2017-11-06 ENCOUNTER — Ambulatory Visit (INDEPENDENT_AMBULATORY_CARE_PROVIDER_SITE_OTHER): Payer: BLUE CROSS/BLUE SHIELD | Admitting: *Deleted

## 2017-11-06 DIAGNOSIS — J309 Allergic rhinitis, unspecified: Secondary | ICD-10-CM | POA: Diagnosis not present

## 2017-11-20 ENCOUNTER — Ambulatory Visit (INDEPENDENT_AMBULATORY_CARE_PROVIDER_SITE_OTHER): Payer: BLUE CROSS/BLUE SHIELD | Admitting: *Deleted

## 2017-11-20 DIAGNOSIS — J309 Allergic rhinitis, unspecified: Secondary | ICD-10-CM | POA: Diagnosis not present

## 2017-12-04 ENCOUNTER — Ambulatory Visit (INDEPENDENT_AMBULATORY_CARE_PROVIDER_SITE_OTHER): Payer: BLUE CROSS/BLUE SHIELD | Admitting: *Deleted

## 2017-12-04 DIAGNOSIS — J309 Allergic rhinitis, unspecified: Secondary | ICD-10-CM | POA: Diagnosis not present

## 2017-12-18 ENCOUNTER — Ambulatory Visit (INDEPENDENT_AMBULATORY_CARE_PROVIDER_SITE_OTHER): Payer: BLUE CROSS/BLUE SHIELD | Admitting: *Deleted

## 2017-12-18 DIAGNOSIS — J309 Allergic rhinitis, unspecified: Secondary | ICD-10-CM | POA: Diagnosis not present

## 2018-01-01 ENCOUNTER — Ambulatory Visit (INDEPENDENT_AMBULATORY_CARE_PROVIDER_SITE_OTHER): Payer: BLUE CROSS/BLUE SHIELD | Admitting: *Deleted

## 2018-01-01 DIAGNOSIS — J309 Allergic rhinitis, unspecified: Secondary | ICD-10-CM

## 2018-01-15 ENCOUNTER — Ambulatory Visit (INDEPENDENT_AMBULATORY_CARE_PROVIDER_SITE_OTHER): Payer: BLUE CROSS/BLUE SHIELD | Admitting: *Deleted

## 2018-01-15 DIAGNOSIS — J309 Allergic rhinitis, unspecified: Secondary | ICD-10-CM

## 2018-01-29 ENCOUNTER — Ambulatory Visit (INDEPENDENT_AMBULATORY_CARE_PROVIDER_SITE_OTHER): Payer: BLUE CROSS/BLUE SHIELD | Admitting: *Deleted

## 2018-01-29 DIAGNOSIS — J309 Allergic rhinitis, unspecified: Secondary | ICD-10-CM | POA: Diagnosis not present

## 2018-02-12 ENCOUNTER — Ambulatory Visit (INDEPENDENT_AMBULATORY_CARE_PROVIDER_SITE_OTHER): Payer: BLUE CROSS/BLUE SHIELD | Admitting: *Deleted

## 2018-02-12 DIAGNOSIS — J309 Allergic rhinitis, unspecified: Secondary | ICD-10-CM

## 2018-02-27 ENCOUNTER — Ambulatory Visit (INDEPENDENT_AMBULATORY_CARE_PROVIDER_SITE_OTHER): Payer: Self-pay | Admitting: *Deleted

## 2018-02-27 DIAGNOSIS — J309 Allergic rhinitis, unspecified: Secondary | ICD-10-CM

## 2018-03-06 DIAGNOSIS — J301 Allergic rhinitis due to pollen: Secondary | ICD-10-CM

## 2018-03-13 ENCOUNTER — Ambulatory Visit (INDEPENDENT_AMBULATORY_CARE_PROVIDER_SITE_OTHER): Payer: Self-pay | Admitting: *Deleted

## 2018-03-13 DIAGNOSIS — J309 Allergic rhinitis, unspecified: Secondary | ICD-10-CM

## 2018-03-30 ENCOUNTER — Ambulatory Visit (INDEPENDENT_AMBULATORY_CARE_PROVIDER_SITE_OTHER): Payer: Self-pay | Admitting: *Deleted

## 2018-03-30 DIAGNOSIS — J309 Allergic rhinitis, unspecified: Secondary | ICD-10-CM

## 2018-04-20 ENCOUNTER — Ambulatory Visit (INDEPENDENT_AMBULATORY_CARE_PROVIDER_SITE_OTHER): Payer: Self-pay | Admitting: *Deleted

## 2018-04-20 DIAGNOSIS — J309 Allergic rhinitis, unspecified: Secondary | ICD-10-CM

## 2018-05-11 ENCOUNTER — Ambulatory Visit (INDEPENDENT_AMBULATORY_CARE_PROVIDER_SITE_OTHER): Payer: Self-pay | Admitting: *Deleted

## 2018-05-11 DIAGNOSIS — J309 Allergic rhinitis, unspecified: Secondary | ICD-10-CM

## 2018-06-01 ENCOUNTER — Ambulatory Visit (INDEPENDENT_AMBULATORY_CARE_PROVIDER_SITE_OTHER): Payer: Self-pay | Admitting: *Deleted

## 2018-06-01 DIAGNOSIS — J309 Allergic rhinitis, unspecified: Secondary | ICD-10-CM

## 2018-06-22 ENCOUNTER — Ambulatory Visit (INDEPENDENT_AMBULATORY_CARE_PROVIDER_SITE_OTHER): Payer: Self-pay | Admitting: *Deleted

## 2018-06-22 DIAGNOSIS — J309 Allergic rhinitis, unspecified: Secondary | ICD-10-CM

## 2018-07-13 ENCOUNTER — Ambulatory Visit (INDEPENDENT_AMBULATORY_CARE_PROVIDER_SITE_OTHER): Payer: Self-pay | Admitting: *Deleted

## 2018-07-13 DIAGNOSIS — J309 Allergic rhinitis, unspecified: Secondary | ICD-10-CM

## 2018-07-27 ENCOUNTER — Ambulatory Visit (INDEPENDENT_AMBULATORY_CARE_PROVIDER_SITE_OTHER): Payer: PRIVATE HEALTH INSURANCE | Admitting: *Deleted

## 2018-07-27 DIAGNOSIS — J309 Allergic rhinitis, unspecified: Secondary | ICD-10-CM

## 2018-08-10 ENCOUNTER — Ambulatory Visit (INDEPENDENT_AMBULATORY_CARE_PROVIDER_SITE_OTHER): Payer: PRIVATE HEALTH INSURANCE | Admitting: *Deleted

## 2018-08-10 DIAGNOSIS — J309 Allergic rhinitis, unspecified: Secondary | ICD-10-CM

## 2018-08-28 ENCOUNTER — Ambulatory Visit (INDEPENDENT_AMBULATORY_CARE_PROVIDER_SITE_OTHER): Payer: PRIVATE HEALTH INSURANCE | Admitting: *Deleted

## 2018-08-28 DIAGNOSIS — J309 Allergic rhinitis, unspecified: Secondary | ICD-10-CM

## 2018-09-14 ENCOUNTER — Ambulatory Visit (INDEPENDENT_AMBULATORY_CARE_PROVIDER_SITE_OTHER): Payer: PRIVATE HEALTH INSURANCE

## 2018-09-14 DIAGNOSIS — J309 Allergic rhinitis, unspecified: Secondary | ICD-10-CM

## 2018-09-24 ENCOUNTER — Other Ambulatory Visit: Payer: Self-pay | Admitting: Internal Medicine

## 2018-09-24 DIAGNOSIS — Z1231 Encounter for screening mammogram for malignant neoplasm of breast: Secondary | ICD-10-CM

## 2018-09-25 ENCOUNTER — Ambulatory Visit
Admission: RE | Admit: 2018-09-25 | Discharge: 2018-09-25 | Disposition: A | Discharge status: Home / Self Care | Payer: No Typology Code available for payment source | Source: Ambulatory Visit | Attending: Internal Medicine | Admitting: Internal Medicine

## 2018-09-25 ENCOUNTER — Encounter: Payer: Self-pay | Admitting: Radiology

## 2018-09-25 DIAGNOSIS — Z1231 Encounter for screening mammogram for malignant neoplasm of breast: Secondary | ICD-10-CM

## 2018-09-28 ENCOUNTER — Other Ambulatory Visit: Payer: Self-pay | Admitting: Internal Medicine

## 2018-09-28 DIAGNOSIS — R928 Other abnormal and inconclusive findings on diagnostic imaging of breast: Secondary | ICD-10-CM

## 2018-10-01 ENCOUNTER — Ambulatory Visit
Admission: RE | Admit: 2018-10-01 | Discharge: 2018-10-01 | Disposition: A | Discharge status: Home / Self Care | Payer: No Typology Code available for payment source | Source: Ambulatory Visit | Attending: Internal Medicine | Admitting: Internal Medicine

## 2018-10-01 DIAGNOSIS — R928 Other abnormal and inconclusive findings on diagnostic imaging of breast: Secondary | ICD-10-CM

## 2018-10-02 ENCOUNTER — Ambulatory Visit (INDEPENDENT_AMBULATORY_CARE_PROVIDER_SITE_OTHER): Payer: PRIVATE HEALTH INSURANCE | Admitting: *Deleted

## 2018-10-02 DIAGNOSIS — J309 Allergic rhinitis, unspecified: Secondary | ICD-10-CM | POA: Diagnosis not present

## 2018-10-16 ENCOUNTER — Ambulatory Visit (INDEPENDENT_AMBULATORY_CARE_PROVIDER_SITE_OTHER): Payer: PRIVATE HEALTH INSURANCE | Admitting: *Deleted

## 2018-10-16 DIAGNOSIS — J309 Allergic rhinitis, unspecified: Secondary | ICD-10-CM | POA: Diagnosis not present

## 2018-11-03 ENCOUNTER — Encounter: Payer: Self-pay | Admitting: *Deleted

## 2018-11-03 DIAGNOSIS — J301 Allergic rhinitis due to pollen: Secondary | ICD-10-CM | POA: Diagnosis not present

## 2018-11-03 NOTE — Progress Notes (Signed)
Vials exp. 11/04/2019 

## 2018-11-09 ENCOUNTER — Ambulatory Visit (INDEPENDENT_AMBULATORY_CARE_PROVIDER_SITE_OTHER): Payer: PRIVATE HEALTH INSURANCE | Admitting: *Deleted

## 2018-11-09 DIAGNOSIS — J309 Allergic rhinitis, unspecified: Secondary | ICD-10-CM | POA: Diagnosis not present

## 2018-12-10 ENCOUNTER — Ambulatory Visit (INDEPENDENT_AMBULATORY_CARE_PROVIDER_SITE_OTHER): Payer: PRIVATE HEALTH INSURANCE | Admitting: *Deleted

## 2018-12-10 DIAGNOSIS — J309 Allergic rhinitis, unspecified: Secondary | ICD-10-CM | POA: Diagnosis not present

## 2018-12-31 ENCOUNTER — Ambulatory Visit (INDEPENDENT_AMBULATORY_CARE_PROVIDER_SITE_OTHER): Payer: PRIVATE HEALTH INSURANCE | Admitting: *Deleted

## 2018-12-31 DIAGNOSIS — J309 Allergic rhinitis, unspecified: Secondary | ICD-10-CM

## 2019-01-21 ENCOUNTER — Ambulatory Visit (INDEPENDENT_AMBULATORY_CARE_PROVIDER_SITE_OTHER): Payer: PRIVATE HEALTH INSURANCE | Admitting: *Deleted

## 2019-01-21 DIAGNOSIS — J309 Allergic rhinitis, unspecified: Secondary | ICD-10-CM

## 2019-02-04 ENCOUNTER — Ambulatory Visit (INDEPENDENT_AMBULATORY_CARE_PROVIDER_SITE_OTHER): Payer: PRIVATE HEALTH INSURANCE | Admitting: *Deleted

## 2019-02-04 DIAGNOSIS — J309 Allergic rhinitis, unspecified: Secondary | ICD-10-CM

## 2019-02-18 ENCOUNTER — Ambulatory Visit (INDEPENDENT_AMBULATORY_CARE_PROVIDER_SITE_OTHER): Payer: PRIVATE HEALTH INSURANCE | Admitting: *Deleted

## 2019-02-18 DIAGNOSIS — J309 Allergic rhinitis, unspecified: Secondary | ICD-10-CM | POA: Diagnosis not present

## 2019-03-08 ENCOUNTER — Ambulatory Visit (INDEPENDENT_AMBULATORY_CARE_PROVIDER_SITE_OTHER): Payer: PRIVATE HEALTH INSURANCE

## 2019-03-08 DIAGNOSIS — J309 Allergic rhinitis, unspecified: Secondary | ICD-10-CM | POA: Diagnosis not present

## 2019-03-23 ENCOUNTER — Ambulatory Visit (INDEPENDENT_AMBULATORY_CARE_PROVIDER_SITE_OTHER): Payer: PRIVATE HEALTH INSURANCE | Admitting: *Deleted

## 2019-03-23 DIAGNOSIS — J309 Allergic rhinitis, unspecified: Secondary | ICD-10-CM | POA: Diagnosis not present

## 2019-04-09 ENCOUNTER — Ambulatory Visit (INDEPENDENT_AMBULATORY_CARE_PROVIDER_SITE_OTHER): Payer: PRIVATE HEALTH INSURANCE

## 2019-04-09 DIAGNOSIS — J309 Allergic rhinitis, unspecified: Secondary | ICD-10-CM

## 2019-05-07 ENCOUNTER — Ambulatory Visit (INDEPENDENT_AMBULATORY_CARE_PROVIDER_SITE_OTHER): Payer: PRIVATE HEALTH INSURANCE | Admitting: *Deleted

## 2019-05-07 DIAGNOSIS — J309 Allergic rhinitis, unspecified: Secondary | ICD-10-CM | POA: Diagnosis not present

## 2019-06-04 ENCOUNTER — Ambulatory Visit (INDEPENDENT_AMBULATORY_CARE_PROVIDER_SITE_OTHER): Payer: PRIVATE HEALTH INSURANCE | Admitting: *Deleted

## 2019-06-04 DIAGNOSIS — J309 Allergic rhinitis, unspecified: Secondary | ICD-10-CM

## 2019-06-24 ENCOUNTER — Other Ambulatory Visit: Payer: Self-pay | Admitting: Allergy

## 2019-06-24 DIAGNOSIS — J4540 Moderate persistent asthma, uncomplicated: Secondary | ICD-10-CM

## 2019-07-02 ENCOUNTER — Ambulatory Visit (INDEPENDENT_AMBULATORY_CARE_PROVIDER_SITE_OTHER): Payer: PRIVATE HEALTH INSURANCE | Admitting: *Deleted

## 2019-07-02 DIAGNOSIS — J309 Allergic rhinitis, unspecified: Secondary | ICD-10-CM | POA: Diagnosis not present

## 2019-07-19 ENCOUNTER — Ambulatory Visit (INDEPENDENT_AMBULATORY_CARE_PROVIDER_SITE_OTHER): Payer: PRIVATE HEALTH INSURANCE | Admitting: *Deleted

## 2019-07-19 DIAGNOSIS — J309 Allergic rhinitis, unspecified: Secondary | ICD-10-CM | POA: Diagnosis not present

## 2019-08-16 ENCOUNTER — Ambulatory Visit (INDEPENDENT_AMBULATORY_CARE_PROVIDER_SITE_OTHER): Payer: PRIVATE HEALTH INSURANCE | Admitting: *Deleted

## 2019-08-16 DIAGNOSIS — J309 Allergic rhinitis, unspecified: Secondary | ICD-10-CM

## 2019-08-17 ENCOUNTER — Ambulatory Visit (INDEPENDENT_AMBULATORY_CARE_PROVIDER_SITE_OTHER): Payer: PRIVATE HEALTH INSURANCE | Admitting: Allergy

## 2019-08-17 ENCOUNTER — Other Ambulatory Visit: Payer: Self-pay

## 2019-08-17 ENCOUNTER — Encounter: Payer: Self-pay | Admitting: Allergy

## 2019-08-17 VITALS — BP 122/86 | HR 78 | Temp 98.6°F | Resp 18 | Ht 65.8 in | Wt 180.8 lb

## 2019-08-17 DIAGNOSIS — J3089 Other allergic rhinitis: Secondary | ICD-10-CM

## 2019-08-17 DIAGNOSIS — J4540 Moderate persistent asthma, uncomplicated: Secondary | ICD-10-CM | POA: Diagnosis not present

## 2019-08-17 MED ORDER — ASMANEX HFA 200 MCG/ACT IN AERO: 2.0000 | INHALATION_SPRAY | Freq: Two times a day (BID) | RESPIRATORY_TRACT | 5 refills | Status: DC

## 2019-08-17 NOTE — Progress Notes (Signed)
Follow-up Note  RE: Kristina [name] MRN: [id number] DOB: Jun 23, 1971 Date of Office Visit: 08/17/2019   History of present illness: Kristina [name] is a 48 y.o. female presenting today for follow-up of asthma and allergic rhinitis.   She was last seen in the office on 09/25/2017 by myself.  She denies any major health changes, surgeries or hospitalizations since last visit.   She states she has been having more cough in the mornings and with activity over the past 6 months.  She denies any reflux symptoms over this time.  She states she just got her albuterol inhaler refilled last week thus has not been using this.  She did start use of her Asmanex 155mcg 2 puffs in the mornings but state she only uses about 4 days of the week and started using about 2 months ago.  She is not sure if this has helped much.  She did see her PCP last week her provided her with a kenalog injection and refilled her albuterol.  She does feel the injection has helped.  She denies nighttime awakenings.  She states she has been having some sinus pressure in forehead with some congestion and started taking Allegra D about 5 days ago with improvement.    She continues on allergen immunotherapy doing well without any large local or systemic reactions.   Review of systems: Review of Systems  Constitutional: Negative for chills, fever and malaise/fatigue.  HENT: Positive for congestion and sinus pain. Negative for ear discharge, ear pain, nosebleeds and sore throat.   Eyes: Negative for discharge and redness.  Respiratory: Positive for cough. Negative for sputum production, shortness of breath and wheezing.   Cardiovascular: Negative.   Gastrointestinal: Negative.   Musculoskeletal: Negative.   Skin: Negative for itching and rash.  Neurological: Positive for headaches. Negative for dizziness.    All other systems negative unless noted above in HPI  Past medical/social/surgical/family history have been reviewed  and are unchanged unless specifically indicated below.  No changes  Medication List: Current Outpatient Medications  Medication Sig Dispense Refill  . Albuterol Sulfate (PROAIR RESPICLICK) 409 (90 Base) MCG/ACT AEPB Inhale 2 puffs into the lungs every 4 (four) hours as needed. 1 each 0  . EPINEPHrine (EPIPEN 2-PAK) 0.3 mg/0.3 mL IJ SOAJ injection Inject 0.3 mLs (0.3 mg total) into the muscle once. 2 Device 2  . Ferrous Sulfate (IRON SUPPLEMENT PO) Take 1 tablet by mouth daily. Reported on 01/04/2016    . fexofenadine-pseudoephedrine (ALLEGRA-D 24) 180-240 MG 24 hr tablet Take 1 tablet by mouth daily.    . fluticasone (FLONASE) 50 MCG/ACT nasal spray USE ONE SPRAY IN EACH NOSTRIL TWICE DAILY 16 g 5  . NP THYROID 60 MG tablet TAKE 1 TABLET BY MOUTH ONCE DAILY ON AN EMPTY STOMACH FOR 90 DAYS    . Olopatadine HCl (PAZEO) 0.7 % SOLN Place 1 drop into both eyes 1 day or 1 dose. 1 Bottle 5   No current facility-administered medications for this visit.      Known medication allergies: No Known Allergies   Physical examination: Blood pressure 122/86, pulse 78, temperature 98.6 F (37 C), temperature source Temporal, resp. rate 18, height 5' 5.8" (1.671 m), weight 180 lb 12.8 oz (82 kg), SpO2 96 %.  General: Alert, interactive, in no acute distress. HEENT: PERRLA, TMs pearly gray, turbinates minimally edematous without discharge, post-pharynx non erythematous. Neck: Supple without lymphadenopathy. Lungs: Clear to auscultation without wheezing, rhonchi or rales. {no increased work of  breathing. CV: Normal S1, S2 without murmurs. Abdomen: Nondistended, nontender. Skin: Warm and dry, without lesions or rashes. Extremities:  No clubbing, cyanosis or edema. Neuro:   Grossly intact.  Diagnositics/Labs: Spirometry: FEV1: 3.09L 103%, FVC: 3.26L 86%, ratio consistent with nonobstructive pattern  Assessment and plan:   Moderate persistent asthma - not well controlled at this time with daily cough  Allergic rhinitis  1. Continue immunotherapy per protocol and EpiPen  2. Treat and prevent inflammation:   A.  Increase to Asmanex - 2 puffs twice a day  3. Have access to albuterol inhaler 2 puffs every 4-6 hours as needed for cough/wheeze/shortness of breath/chest tightness.  May use 15-20 minutes prior to activity.   Monitor frequency of use.    4. Can continue Allegra D at this time daily until sinus symptoms improve then go back to plain Allegra or another antihistamine (without decongestant)   5. Let us know if 2-4 weeks if cough is improved or not with increased Asmanex.   If still with lingering cough then would add back in Singulair and we can refill for you at that time  6. "Action plan" for asthma flare up: Increase Asmanex 200 to 3 inhalations three times a day and use ProAir HFA if needed  Asthma control goals:   Full participation in all desired activities (may need albuterol before activity)  Albuterol use two time or less a week on average (not counting use with activity)  Cough interfering with sleep two time or less a month  Oral steroids no more than once a year  No hospitalizations  7. Return to clinic in 4 months or earlier if problem  I appreciate the opportunity to take part in Estela's care. Please do not hesitate to contact me with questions.  Sincerely,   Margo Aye, MD Allergy/Immunology [facility]

## 2019-08-17 NOTE — Patient Instructions (Addendum)
Moderate persistent asthma Allergic rhinitis  1. Continue immunotherapy per protocol and EpiPen  2. Treat and prevent inflammation:   A.  Increase to Asmanex 275mcg - 2 puffs twice a day  3. Have access to albuterol inhaler 2 puffs every 4-6 hours as needed for cough/wheeze/shortness of breath/chest tightness.  May use 15-20 minutes prior to activity.   Monitor frequency of use.    4. Can continue Allegra D at this time daily until sinus symptoms improve then go back to plain Allegra or another antihistamine (without decongestant)   5. Let us know if 2-4 weeks if cough is improved or not with increased Asmanex.   If still with lingering cough then would add back in Singulair and we can refill for you at that time  6. "Action plan" for asthma flare up: Increase Asmanex 200 to 3 inhalations three times a day and use ProAir HFA if needed  Asthma control goals:   Full participation in all desired activities (may need albuterol before activity)  Albuterol use two time or less a week on average (not counting use with activity)  Cough interfering with sleep two time or less a month  Oral steroids no more than once a year  No hospitalizations  7. Return to clinic in 4 months or earlier if problem

## 2019-08-26 DIAGNOSIS — J301 Allergic rhinitis due to pollen: Secondary | ICD-10-CM | POA: Diagnosis not present

## 2019-09-07 ENCOUNTER — Other Ambulatory Visit: Payer: Self-pay

## 2019-09-07 ENCOUNTER — Inpatient Hospital Stay (HOSPITAL_COMMUNITY)
Admission: EM | Admit: 2019-09-07 | Discharge: 2019-09-12 | DRG: 177 | Disposition: A | Discharge status: Home / Self Care | Payer: No Typology Code available for payment source | Attending: Internal Medicine | Admitting: Internal Medicine

## 2019-09-07 ENCOUNTER — Encounter (HOSPITAL_COMMUNITY): Payer: Self-pay

## 2019-09-07 ENCOUNTER — Emergency Department (HOSPITAL_COMMUNITY): Payer: No Typology Code available for payment source

## 2019-09-07 DIAGNOSIS — Z79899 Other long term (current) drug therapy: Secondary | ICD-10-CM

## 2019-09-07 DIAGNOSIS — J1289 Other viral pneumonia: Secondary | ICD-10-CM | POA: Diagnosis present

## 2019-09-07 DIAGNOSIS — J1282 Pneumonia due to coronavirus disease 2019: Secondary | ICD-10-CM | POA: Diagnosis present

## 2019-09-07 DIAGNOSIS — R05 Cough: Secondary | ICD-10-CM

## 2019-09-07 DIAGNOSIS — R059 Cough, unspecified: Secondary | ICD-10-CM

## 2019-09-07 DIAGNOSIS — E039 Hypothyroidism, unspecified: Secondary | ICD-10-CM | POA: Diagnosis present

## 2019-09-07 DIAGNOSIS — Z87891 Personal history of nicotine dependence: Secondary | ICD-10-CM

## 2019-09-07 DIAGNOSIS — Z9981 Dependence on supplemental oxygen: Secondary | ICD-10-CM

## 2019-09-07 DIAGNOSIS — E876 Hypokalemia: Secondary | ICD-10-CM

## 2019-09-07 DIAGNOSIS — U071 COVID-19: Secondary | ICD-10-CM | POA: Diagnosis not present

## 2019-09-07 DIAGNOSIS — J9601 Acute respiratory failure with hypoxia: Secondary | ICD-10-CM | POA: Diagnosis present

## 2019-09-07 DIAGNOSIS — J4540 Moderate persistent asthma, uncomplicated: Secondary | ICD-10-CM | POA: Diagnosis present

## 2019-09-07 HISTORY — DX: Disorder of thyroid, unspecified: E07.9

## 2019-09-07 LAB — CBC
HCT: 39.7 % (ref 36.0–46.0)
Hemoglobin: 12.2 g/dL (ref 12.0–15.0)
MCH: 23.1 pg — ABNORMAL LOW (ref 26.0–34.0)
MCHC: 30.7 g/dL (ref 30.0–36.0)
MCV: 75 fL — ABNORMAL LOW (ref 80.0–100.0)
Platelets: 227 10*3/uL (ref 150–400)
RBC: 5.29 MIL/uL — ABNORMAL HIGH (ref 3.87–5.11)
RDW: 18.2 % — ABNORMAL HIGH (ref 11.5–15.5)
WBC: 8.3 10*3/uL (ref 4.0–10.5)
nRBC: 0 % (ref 0.0–0.2)

## 2019-09-07 LAB — BASIC METABOLIC PANEL
Anion gap: 12 (ref 5–15)
BUN: 8 mg/dL (ref 6–20)
CO2: 22 mmol/L (ref 22–32)
Calcium: 8.3 mg/dL — ABNORMAL LOW (ref 8.9–10.3)
Chloride: 105 mmol/L (ref 98–111)
Creatinine, Ser: 0.87 mg/dL (ref 0.44–1.00)
GFR calc Af Amer: >60 mL/min (ref >60)
GFR calc non Af Amer: >60 mL/min (ref >60)
Glucose, Bld: 110 mg/dL — ABNORMAL HIGH (ref 70–99)
Potassium: 3.6 mmol/L (ref 3.5–5.1)
Sodium: 139 mmol/L (ref 135–145)

## 2019-09-07 LAB — I-STAT BETA HCG BLOOD, ED (MC, WL, AP ONLY): I-stat hCG, quantitative: <5 m[IU]/mL (ref <5)

## 2019-09-07 NOTE — ED Notes (Signed)
Pt placed on 2L [place] for comfort.

## 2019-09-07 NOTE — ED Triage Notes (Signed)
Pt from home with ems with worsening COVID symptoms for the past week: SOB, cough and fever. Daughter and husband are both COVID positive.  91% on room air 98% on 4L  P96 sinus Temp 98.1 rr26 Clear lung sounds CBG 177 hx of diabetes and asthma  Pt a.o, no resp distress noted

## 2019-09-08 ENCOUNTER — Encounter (HOSPITAL_COMMUNITY): Payer: Self-pay | Admitting: Internal Medicine

## 2019-09-08 DIAGNOSIS — J9601 Acute respiratory failure with hypoxia: Secondary | ICD-10-CM

## 2019-09-08 DIAGNOSIS — Z9981 Dependence on supplemental oxygen: Secondary | ICD-10-CM | POA: Diagnosis not present

## 2019-09-08 DIAGNOSIS — Z79899 Other long term (current) drug therapy: Secondary | ICD-10-CM | POA: Diagnosis not present

## 2019-09-08 DIAGNOSIS — U071 COVID-19: Secondary | ICD-10-CM | POA: Diagnosis present

## 2019-09-08 DIAGNOSIS — E876 Hypokalemia: Secondary | ICD-10-CM | POA: Diagnosis not present

## 2019-09-08 DIAGNOSIS — J4540 Moderate persistent asthma, uncomplicated: Secondary | ICD-10-CM | POA: Diagnosis present

## 2019-09-08 DIAGNOSIS — J1289 Other viral pneumonia: Secondary | ICD-10-CM

## 2019-09-08 DIAGNOSIS — E039 Hypothyroidism, unspecified: Secondary | ICD-10-CM | POA: Diagnosis present

## 2019-09-08 DIAGNOSIS — Z87891 Personal history of nicotine dependence: Secondary | ICD-10-CM | POA: Diagnosis not present

## 2019-09-08 LAB — COMPREHENSIVE METABOLIC PANEL
ALT: 23 U/L (ref 0–44)
AST: 27 U/L (ref 15–41)
Albumin: 2.8 g/dL — ABNORMAL LOW (ref 3.5–5.0)
Alkaline Phosphatase: 79 U/L (ref 38–126)
Anion gap: 13 (ref 5–15)
BUN: 14 mg/dL (ref 6–20)
CO2: 24 mmol/L (ref 22–32)
Calcium: 8.2 mg/dL — ABNORMAL LOW (ref 8.9–10.3)
Chloride: 103 mmol/L (ref 98–111)
Creatinine, Ser: 0.93 mg/dL (ref 0.44–1.00)
GFR calc Af Amer: >60 mL/min (ref >60)
GFR calc non Af Amer: >60 mL/min (ref >60)
Glucose, Bld: 115 mg/dL — ABNORMAL HIGH (ref 70–99)
Potassium: 3.4 mmol/L — ABNORMAL LOW (ref 3.5–5.1)
Sodium: 140 mmol/L (ref 135–145)
Total Bilirubin: 0.4 mg/dL (ref 0.3–1.2)
Total Protein: 6.9 g/dL (ref 6.5–8.1)

## 2019-09-08 LAB — FIBRINOGEN: Fibrinogen: 747 mg/dL — ABNORMAL HIGH (ref 210–475)

## 2019-09-08 LAB — D-DIMER, QUANTITATIVE: D-Dimer, Quant: 0.46 ug/mL-FEU (ref 0.00–0.50)

## 2019-09-08 LAB — LACTATE DEHYDROGENASE: LDH: 214 U/L — ABNORMAL HIGH (ref 98–192)

## 2019-09-08 LAB — HIV ANTIBODY (ROUTINE TESTING W REFLEX): HIV Screen 4th Generation wRfx: NONREACTIVE

## 2019-09-08 LAB — ABO/RH: ABO/RH(D): B POS

## 2019-09-08 LAB — C-REACTIVE PROTEIN: CRP: 13.9 mg/dL — ABNORMAL HIGH (ref <1.0)

## 2019-09-08 LAB — PROCALCITONIN: Procalcitonin: <0.1 ng/mL

## 2019-09-08 LAB — FERRITIN: Ferritin: 41 ng/mL (ref 11–307)

## 2019-09-08 LAB — POC SARS CORONAVIRUS 2 AG -  ED: SARS Coronavirus 2 Ag: POSITIVE — AB

## 2019-09-08 LAB — MAGNESIUM: Magnesium: 2.2 mg/dL (ref 1.7–2.4)

## 2019-09-08 MED ORDER — ASCORBIC ACID 500 MG PO TABS
500.0000 mg | ORAL_TABLET | Freq: Every day | ORAL | Status: DC
  Administered 2019-09-08 – 2019-09-12 (×5): 500 mg via ORAL
  Filled 2019-09-08 (×5): qty 1

## 2019-09-08 MED ORDER — DEXAMETHASONE SODIUM PHOSPHATE 10 MG/ML IJ SOLN
6.0000 mg | INTRAMUSCULAR | Status: DC
  Administered 2019-09-09 – 2019-09-12 (×4): 6 mg via INTRAVENOUS
  Filled 2019-09-08 (×5): qty 1

## 2019-09-08 MED ORDER — ALBUTEROL SULFATE HFA 108 (90 BASE) MCG/ACT IN AERS
2.0000 | INHALATION_SPRAY | Freq: Once | RESPIRATORY_TRACT | Status: AC
  Administered 2019-09-08: 2 via RESPIRATORY_TRACT
  Filled 2019-09-08: qty 6.7

## 2019-09-08 MED ORDER — GUAIFENESIN-CODEINE 100-10 MG/5ML PO SOLN
5.0000 mL | ORAL | Status: DC | PRN
  Administered 2019-09-09 – 2019-09-11 (×9): 5 mL via ORAL
  Filled 2019-09-08 (×9): qty 5

## 2019-09-08 MED ORDER — SODIUM CHLORIDE 0.9 % IV SOLN
200.0000 mg | Freq: Once | INTRAVENOUS | Status: AC
  Administered 2019-09-08: 06:00:00 200 mg via INTRAVENOUS
  Filled 2019-09-08: qty 40

## 2019-09-08 MED ORDER — ALBUTEROL SULFATE HFA 108 (90 BASE) MCG/ACT IN AERS: 2.0000 | INHALATION_SPRAY | RESPIRATORY_TRACT | Status: DC | PRN

## 2019-09-08 MED ORDER — POTASSIUM CHLORIDE CRYS ER 20 MEQ PO TBCR
40.0000 meq | EXTENDED_RELEASE_TABLET | Freq: Once | ORAL | Status: AC
  Administered 2019-09-08: 40 meq via ORAL
  Filled 2019-09-08: qty 2

## 2019-09-08 MED ORDER — VITAMIN D 25 MCG (1000 UNIT) PO TABS
2000.0000 [IU] | ORAL_TABLET | Freq: Every day | ORAL | Status: DC
  Administered 2019-09-08 – 2019-09-12 (×5): 2000 [IU] via ORAL
  Filled 2019-09-08 (×6): qty 2

## 2019-09-08 MED ORDER — SODIUM CHLORIDE 0.9 % IV SOLN
100.0000 mg | Freq: Every day | INTRAVENOUS | Status: AC
  Administered 2019-09-09 – 2019-09-12 (×4): 100 mg via INTRAVENOUS
  Filled 2019-09-08 (×5): qty 20

## 2019-09-08 MED ORDER — ACETAMINOPHEN 325 MG PO TABS
650.0000 mg | ORAL_TABLET | Freq: Four times a day (QID) | ORAL | Status: DC | PRN
  Administered 2019-09-09: 650 mg via ORAL
  Filled 2019-09-08: qty 2

## 2019-09-08 MED ORDER — ZINC SULFATE 220 (50 ZN) MG PO CAPS
220.0000 mg | ORAL_CAPSULE | Freq: Every day | ORAL | Status: DC
  Administered 2019-09-08 – 2019-09-12 (×5): 220 mg via ORAL
  Filled 2019-09-08 (×5): qty 1

## 2019-09-08 MED ORDER — ENOXAPARIN SODIUM 40 MG/0.4ML ~~LOC~~ SOLN
40.0000 mg | SUBCUTANEOUS | Status: DC
  Administered 2019-09-08 – 2019-09-12 (×5): 40 mg via SUBCUTANEOUS
  Filled 2019-09-08 (×6): qty 0.4

## 2019-09-08 MED ORDER — DEXAMETHASONE SODIUM PHOSPHATE 10 MG/ML IJ SOLN
6.0000 mg | Freq: Once | INTRAMUSCULAR | Status: AC
  Administered 2019-09-08: 03:00:00 6 mg via INTRAVENOUS
  Filled 2019-09-08: qty 1

## 2019-09-08 NOTE — ED Notes (Signed)
Pt given lunch tray.

## 2019-09-08 NOTE — H&P (Addendum)
History and Physical    Kristina [name] PPJ:[id number] DOB: 1970/10/15 DOA: 09/07/2019  PCP: Ernestene Kiel, MD Patient coming from: Home  Chief Complaint: Shortness of breath  HPI: Kristina [name] is a 48 y.o. female with medical history significant of asthma, hypothyroidism presenting with complaints of shortness of breath, cough, and fever.  Patient states her husband and daughter recently tested positive for COVID-19.  Her symptoms started 9 days ago. She was having fevers for the first few days of her illness which have now stopped.  However, shortness of breath and cough have been getting progressively worse.  Denies fatigue, body aches, or loss of taste/smell.  Denies nausea, vomiting, diarrhea, or abdominal pain.  ED Course: Rapid SARS-CoV-2 antigen test positive.  Oxygen saturation 91% on room air, improved with 2 L supplemental oxygen.  Afebrile and no leukocytosis.  LFTs normal.  Chest x-ray (personally reviewed) showing linear opacities at both lung bases and mild hazy opacity at the left lung base. Patient received IV Decadron 6 mg and was started on remdesivir.  Review of Systems:  All systems reviewed and apart from history of presenting illness, are negative.  Past Medical History:  Diagnosis Date  . Asthma   . Thyroid disease     Past Surgical History:  Procedure Laterality Date  . CHOLECYSTECTOMY  2009  . ECTOPIC PREGNANCY SURGERY       reports that she has quit smoking. She has never used smokeless tobacco. She reports that she does not drink alcohol or use drugs.  No Known Allergies  Family History  Problem Relation Age of Onset  . Breast cancer Neg Hx     Prior to Admission medications   Medication Sig Start Date End Date Taking? Authorizing Provider  Albuterol Sulfate (PROAIR RESPICLICK) 580 (90 Base) MCG/ACT AEPB Inhale 2 puffs into the lungs every 4 (four) hours as needed. 09/05/17   Kozlow, Donnamarie Poag, MD  EPINEPHrine (EPIPEN 2-PAK) 0.3 mg/0.3  mL IJ SOAJ injection Inject 0.3 mLs (0.3 mg total) into the muscle once. 07/06/15   Kozlow, Donnamarie Poag, MD  Ferrous Sulfate (IRON SUPPLEMENT PO) Take 1 tablet by mouth daily. Reported on 01/04/2016    [provider]  fexofenadine-pseudoephedrine (ALLEGRA-D 24) 180-240 MG 24 hr tablet Take 1 tablet by mouth daily.    [provider]  fluticasone (FLONASE) 50 MCG/ACT nasal spray USE ONE SPRAY IN EACH NOSTRIL TWICE DAILY 01/04/16   Kozlow, Donnamarie Poag, MD  Mometasone Furoate [facility] HFA) 200 MCG/ACT AERO Inhale 2 puffs into the lungs 2 (two) times daily. Rinse, gargle, and spit after use. 08/17/19   Kennith Gain, MD  NP THYROID 60 MG tablet TAKE 1 TABLET BY MOUTH ONCE DAILY ON AN EMPTY STOMACH FOR 90 DAYS 05/14/19   [provider]  Olopatadine HCl (PAZEO) 0.7 % SOLN Place 1 drop into both eyes 1 day or 1 dose. 09/25/17   Kennith Gain, MD    Physical Exam: Vitals:   09/07/19 1621 09/07/19 1821 09/07/19 2059 09/08/19 0033  BP: 135/90 131/84 122/66 136/75  Pulse: (!) 106 87 (!) 101 94  Resp: 19 18 (!) 22 18  Temp:    98.2 F (36.8 C)  TempSrc:    Oral  SpO2: 98% 99% 99% 95%  Weight:      Height:        Physical Exam  Constitutional: She is oriented to person, place, and time. She appears well-developed and well-nourished. No distress.  HENT:  Head:  Normocephalic.  Eyes: Right eye exhibits no discharge. Left eye exhibits no discharge.  Cardiovascular: Normal rate, regular rhythm and intact distal pulses.  Pulmonary/Chest: She is in respiratory distress. She has no wheezes. She has rales.  Oxygen saturation in the mid 90s on 2 L supplemental oxygen via nasal cannula Slightly tachypneic with respiratory rate in the low 20s Bibasilar rales  Abdominal: Soft. Bowel sounds are normal. She exhibits no distension. There is no abdominal tenderness. There is no guarding.  Musculoskeletal:        General: No edema.     Cervical back: Neck supple.    Neurological: She is alert and oriented to person, place, and time.  Skin: Skin is warm and dry. She is not diaphoretic.     Labs on Admission: I have personally reviewed following labs and imaging studies  CBC: Recent Labs  Lab 09/07/19 1232  WBC 8.3  HGB 12.2  HCT 39.7  MCV 75.0*  PLT 227   Basic Metabolic Panel: Recent Labs  Lab 09/07/19 1232 09/08/19 0409  NA 139 140  K 3.6 3.4*  CL 105 103  CO2 22 24  GLUCOSE 110* 115*  BUN 8 14  CREATININE 0.87 0.93  CALCIUM 8.3* 8.2*   GFR: Estimated Creatinine Clearance: 75.4 mL/min (by C-G formula based on SCr of 0.93 mg/dL). Liver Function Tests: Recent Labs  Lab 09/08/19 0409  AST 27  ALT 23  ALKPHOS 79  BILITOT 0.4  PROT 6.9  ALBUMIN 2.8*   No results for input(s): LIPASE, AMYLASE in the last 168 hours. No results for input(s): AMMONIA in the last 168 hours. Coagulation Profile: No results for input(s): INR, PROTIME in the last 168 hours. Cardiac Enzymes: No results for input(s): CKTOTAL, CKMB, CKMBINDEX, TROPONINI in the last 168 hours. BNP (last 3 results) No results for input(s): PROBNP in the last 8760 hours. HbA1C: No results for input(s): HGBA1C in the last 72 hours. CBG: No results for input(s): GLUCAP in the last 168 hours. Lipid Profile: No results for input(s): CHOL, HDL, LDLCALC, TRIG, CHOLHDL, LDLDIRECT in the last 72 hours. Thyroid Function Tests: No results for input(s): TSH, T4TOTAL, FREET4, T3FREE, THYROIDAB in the last 72 hours. Anemia Panel: No results for input(s): VITAMINB12, FOLATE, FERRITIN, TIBC, IRON, RETICCTPCT in the last 72 hours. Urine analysis: No results found for: COLORURINE, APPEARANCEUR, LABSPEC, PHURINE, GLUCOSEU, HGBUR, BILIRUBINUR, KETONESUR, PROTEINUR, UROBILINOGEN, NITRITE, LEUKOCYTESUR  Radiological Exams on Admission: DG Chest 1 View  Result Date: 09/07/2019 CLINICAL DATA:  Pt from home with ems with worsening COVID symptoms for the past week: SOB, cough and  fever. Daughter and husband are both COVID positive. Pt is on 4L of O2. Pt hx asthma. Pt is a former smoker EXAM: CHEST  1 VIEW COMPARISON:  None. FINDINGS: Linear opacities are noted at both lung bases. Mild hazy opacity noted at the left lung base. Mid and upper lungs are clear. No pleural effusion.  No pneumothorax. Cardiac silhouette is normal in size. No mediastinal or hilar masses or convincing adenopathy. Skeletal structures are grossly intact. IMPRESSION: 1. Linear lung base opacities are noted bilaterally that likely due to scarring or atelectasis. Mild hazy opacity at the left lung base may reflect infection. No other evidence of acute cardiopulmonary disease. Electronically Signed   By: Amie Portlandavid  Ormond M.D.   On: 09/07/2019 13:09    EKG: Independently reviewed.  Sinus rhythm.  No prior tracing for comparison.  Assessment/Plan Principal Problem:   Pneumonia due to COVID-19 virus Active Problems:  Moderate persistent asthma   Acute respiratory failure with hypoxia (HCC)   Hypokalemia   Acute hypoxic respiratory failure secondary to COVID-19 viral pneumonia Rapid SARS-CoV-2 antigen test positive.  Oxygen saturation 91% on room air, improved with 2 L supplemental oxygen. Chest x-ray (personally reviewed) showing linear opacities at both lung bases and mild hazy opacity at the left lung base.  Bacterial pneumonia less likely given no leukocytosis. -Remdesivir dosing per pharmacy -IV Decadron 6 mg daily -Vitamin C, zinc, vitamin D3 -Antitussive as needed -Tylenol as needed -Check inflammatory markers now including ferritin, fibrinogen, D-dimer, CRP, LDH -Check procalcitonin level -Daily CBC with differential, CMP, CRP, D-dimer, LDH -Airborne and contact precautions -Continuous pulse ox -Supplemental oxygen as needed to keep oxygen saturation above 90% -Blood culture x2 -Self proning 2 to 3 hours every 12 hours  Asthma Stable.  No bronchospasm.   -Albuterol inhaler as needed  Mild  hypokalemia Potassium 3.4.  Likely secondary to decreased p.o. intake.  Not on a diuretic. -Replete potassium.  Check magnesium level and replete if low.  Continue to monitor electrolytes.  Pharmacy med rec pending.  DVT prophylaxis: Lovenox Code Status: Full code Family Communication: No family available. Disposition Plan: Anticipate discharge after clinical improvement. Consults called: None Admission status: It is my clinical opinion that admission to INPATIENT is reasonable and necessary in this 48 y.o. female . presenting with acute hypoxic respiratory failure secondary to COVID-19 viral pneumonia.  Has a new supplemental oxygen requirement.  High risk of decompensation.  Given the aforementioned, the predictability of an adverse outcome is felt to be significant. I expect that the patient will require at least 2 midnights in the hospital to treat this condition.   The medical decision making on this patient was of high complexity and the patient is at high risk for clinical deterioration, therefore this is a level 3 visit.  John Giovanni MD [facility] Pager [phone]  If 7PM-7AM, please contact night-coverage [email or web address] Password [email or web address]  09/08/2019, 6:01 AM

## 2019-09-08 NOTE — ED Notes (Signed)
Ordered breakfast--Kristina [name] 

## 2019-09-08 NOTE — Progress Notes (Addendum)
Progress Note    Kristina [name]  VWU:[id number] DOB: 1971-02-04  DOA: 09/07/2019 PCP: Ernestene Kiel, MD      Brief Narrative:    Medical records reviewed and are as summarized below:  Kristina [name] is an 48 y.o. female medical history significant for asthma, hypothyroidism who presented to the hospital with cough, shortness of breath and fever.  She was found to have COVID-19 infection.  Chest x-ray showed linear lung base opacities bilaterally suspicious for scarring or atelectasis and there was mild hazy opacity of the left lung base was suspicious for infection. She was hypoxemic requiring 4 L of oxygen via nasal cannula.     Assessment/Plan:   Principal Problem:   Pneumonia due to COVID-19 virus Active Problems:   Moderate persistent asthma   Acute respiratory failure with hypoxia (HCC)   Hypokalemia   Body mass index is 29.12 kg/m.  COVID-19 pneumonia: Continue IV remdesivir IV steroids.  Monitor inflammatory markers.  Robitussin as needed for cough.  Acute hypoxemic respiratory failure: She is on 4 L/min oxygen via nasal cannula.  Taper off as able.  Moderate persistent asthma: Stable.  Albuterol inhaler as needed for wheezing   Mild hypokalemia: Replete potassium and monitor levels.  Magnesium level is normal.   Family Communication/Anticipated D/C date and plan/Code Status   DVT prophylaxis: Lovenox Code Status: Full code Family Communication: Plan discussed with the patient Disposition Plan: To be determined      Subjective:   Complains of cough and shortness of breath is worse with minimal exertion but improves with rest.  She feels better today compared to yesterday.  Objective:    Vitals:   09/08/19 0930 09/08/19 1000 09/08/19 1030 09/08/19 1100  BP: 109/67 121/78 125/77 127/84  Pulse: 81 88 95 84  Resp: 19 (!) 21 (!) 24 (!) 22  Temp:      TempSrc:      SpO2: 97% 96% 96% 95%  Weight:      Height:       No intake  or output data in the 24 hours ending 09/08/19 1143 Filed Weights   09/07/19 1221  Weight: 79.4 kg    Exam:  GEN: NAD SKIN: No rash EYES: EOMI, anicteric ENT: MMM CV: RRR PULM: CTA B ABD: soft, ND, NT, +BS CNS: AAO x 3, non focal EXT: No edema or tenderness   Data Reviewed:   I have personally reviewed following labs and imaging studies:  Labs: Labs show the following:   Basic Metabolic Panel: Recent Labs  Lab 09/07/19 1232 09/08/19 0409 09/08/19 0553  NA 139 140  --   K 3.6 3.4*  --   CL 105 103  --   CO2 22 24  --   GLUCOSE 110* 115*  --   BUN 8 14  --   CREATININE 0.87 0.93  --   CALCIUM 8.3* 8.2*  --   MG  --   --  2.2   GFR Estimated Creatinine Clearance: 75.4 mL/min (by C-G formula based on SCr of 0.93 mg/dL). Liver Function Tests: Recent Labs  Lab 09/08/19 0409  AST 27  ALT 23  ALKPHOS 79  BILITOT 0.4  PROT 6.9  ALBUMIN 2.8*   No results for input(s): LIPASE, AMYLASE in the last 168 hours. No results for input(s): AMMONIA in the last 168 hours. Coagulation profile No results for input(s): INR, PROTIME in the last 168 hours.  CBC: Recent Labs  Lab 09/07/19 1232  WBC 8.3  HGB 12.2  HCT 39.7  MCV 75.0*  PLT 227   Cardiac Enzymes: No results for input(s): CKTOTAL, CKMB, CKMBINDEX, TROPONINI in the last 168 hours. BNP (last 3 results) No results for input(s): PROBNP in the last 8760 hours. CBG: No results for input(s): GLUCAP in the last 168 hours. D-Dimer: Recent Labs    09/08/19 0553  DDIMER 0.46   Hgb A1c: No results for input(s): HGBA1C in the last 72 hours. Lipid Profile: No results for input(s): CHOL, HDL, LDLCALC, TRIG, CHOLHDL, LDLDIRECT in the last 72 hours. Thyroid function studies: No results for input(s): TSH, T4TOTAL, T3FREE, THYROIDAB in the last 72 hours.  Invalid input(s): FREET3 Anemia work up: Recent Labs    09/08/19 0553  FERRITIN 41   Sepsis Labs: Recent Labs  Lab 09/07/19 1232 09/08/19 0553    PROCALCITON  --  <0.10  WBC 8.3  --     Microbiology No results found for this or any previous visit (from the past 240 hour(s)).  Procedures and diagnostic studies:  DG Chest 1 View  Result Date: 09/07/2019 CLINICAL DATA:  Pt from home with ems with worsening COVID symptoms for the past week: SOB, cough and fever. Daughter and husband are both COVID positive. Pt is on 4L of O2. Pt hx asthma. Pt is a former smoker EXAM: CHEST  1 VIEW COMPARISON:  None. FINDINGS: Linear opacities are noted at both lung bases. Mild hazy opacity noted at the left lung base. Mid and upper lungs are clear. No pleural effusion.  No pneumothorax. Cardiac silhouette is normal in size. No mediastinal or hilar masses or convincing adenopathy. Skeletal structures are grossly intact. IMPRESSION: 1. Linear lung base opacities are noted bilaterally that likely due to scarring or atelectasis. Mild hazy opacity at the left lung base may reflect infection. No other evidence of acute cardiopulmonary disease. Electronically Signed   By: Amie Portland M.D.   On: 09/07/2019 13:09    Medications:   . vitamin C  500 mg Oral Daily  . cholecalciferol  2,000 Units Oral Daily  . [START ON 09/09/2019] dexamethasone (DECADRON) injection  6 mg Intravenous Q24H  . enoxaparin (LOVENOX) injection  40 mg Subcutaneous Q24H  . zinc sulfate  220 mg Oral Daily   Continuous Infusions: . [START ON 09/09/2019] remdesivir 100 mg in NS 100 mL       LOS: 0 days   Murvin Gift  [facility]   *Please refer to [email or web address], password [email or web address] to get updated schedule on who will round on this patient, as hospitalists switch teams weekly. If 7PM-7AM, please contact night-coverage at [email or web address], password [email or web address] for any overnight needs.  09/08/2019, 11:43 AM

## 2019-09-08 NOTE — ED Notes (Signed)
poc covid 19 postive + CALL DR.WICKLINE RESAULT

## 2019-09-08 NOTE — ED Notes (Signed)
[facility] called to transport patient  

## 2019-09-08 NOTE — ED Notes (Signed)
Lunch tray ordered 

## 2019-09-08 NOTE — ED Provider Notes (Signed)
[facility] EMERGENCY DEPARTMENT Provider Note   CSN: [id number] Arrival date & time: 09/07/19  1209     History Chief Complaint  Patient presents with  . Cough  . Shortness of Breath    Kristina [name] is a 48 y.o. female.  The history is provided by the patient.  Cough Cough characteristics:  Non-productive Severity:  Moderate Onset quality:  Gradual Duration:  11 days Timing:  Intermittent Progression:  Worsening Chronicity:  New Smoker: no   Relieved by:  Nothing Worsened by:  Nothing Associated symptoms: fever, myalgias, shortness of breath and wheezing   Associated symptoms: no chest pain   Shortness of Breath Associated symptoms: cough, fever and wheezing   Associated symptoms: no chest pain   Patient presents from home for shortness of breath with concern for COVID-19.  Patient reports that on December 7 she began having fatigue, myalgias and cough.  She has had some fevers.  She reports over the past day she has had worsening shortness of breath.  No active chest pain. She reports to family members have been diagnosed with COVID-19.  The patient has not been tested as of yet     Past Medical History:  Diagnosis Date  . Asthma   . Thyroid disease     Patient Active Problem List   Diagnosis Date Noted  . Moderate persistent asthma 06/22/2015  . Other allergic rhinitis 06/22/2015  . Laryngopharyngeal reflux (LPR) 06/22/2015    Past Surgical History:  Procedure Laterality Date  . CHOLECYSTECTOMY  2009  . ECTOPIC PREGNANCY SURGERY       OB History   No obstetric history on file.     Family History  Problem Relation Age of Onset  . Breast cancer Neg Hx     Social History   Tobacco Use  . Smoking status: Former Research scientist (life sciences)  . Smokeless tobacco: Never Used  Substance Use Topics  . Alcohol use: No  . Drug use: No    Home Medications Prior to Admission medications   Medication Sig Start Date End Date Taking? Authorizing  Provider  Albuterol Sulfate (PROAIR RESPICLICK) 678 (90 Base) MCG/ACT AEPB Inhale 2 puffs into the lungs every 4 (four) hours as needed. 09/05/17   Kozlow, Donnamarie Poag, MD  EPINEPHrine (EPIPEN 2-PAK) 0.3 mg/0.3 mL IJ SOAJ injection Inject 0.3 mLs (0.3 mg total) into the muscle once. 07/06/15   Kozlow, Donnamarie Poag, MD  Ferrous Sulfate (IRON SUPPLEMENT PO) Take 1 tablet by mouth daily. Reported on 01/04/2016    [provider]  fexofenadine-pseudoephedrine (ALLEGRA-D 24) 180-240 MG 24 hr tablet Take 1 tablet by mouth daily.    [provider]  fluticasone (FLONASE) 50 MCG/ACT nasal spray USE ONE SPRAY IN EACH NOSTRIL TWICE DAILY 01/04/16   Kozlow, Donnamarie Poag, MD  Mometasone Furoate [facility] HFA) 200 MCG/ACT AERO Inhale 2 puffs into the lungs 2 (two) times daily. Rinse, gargle, and spit after use. 08/17/19   Kennith Gain, MD  NP THYROID 60 MG tablet TAKE 1 TABLET BY MOUTH ONCE DAILY ON AN EMPTY STOMACH FOR 90 DAYS 05/14/19   [provider]  Olopatadine HCl (PAZEO) 0.7 % SOLN Place 1 drop into both eyes 1 day or 1 dose. 09/25/17   Kennith Gain, MD    Allergies    Patient has no known allergies.  Review of Systems   Review of Systems  Constitutional: Positive for fever.  Respiratory: Positive for cough, shortness of breath and wheezing.  Cardiovascular: Negative for chest pain.  Musculoskeletal: Positive for myalgias.  All other systems reviewed and are negative.   Physical Exam Updated Vital Signs BP 136/75 (BP Location: Left Arm)   Pulse 94   Temp 98.2 F (36.8 C) (Oral)   Resp 18   Ht 1.651 m (5\' 5" )   Wt 79.4 kg   SpO2 95%   BMI 29.12 kg/m   Physical Exam CONSTITUTIONAL: [name] developed/[name] nourished HEAD: Normocephalic/atraumatic EYES: EOMI/PERRL ENMT: Mask in place NECK: supple no meningeal signs SPINE/BACK:entire spine nontender CV: S1/S2 noted, no murmurs/rubs/gallops noted LUNGS: Mild tachypnea, bibasilar crackles noted ABDOMEN:  soft, nontender NEURO: Pt is awake/alert/appropriate, moves all extremitiesx4.  No facial droop.   EXTREMITIES: pulses normal/equal, full ROM SKIN: warm, color normal PSYCH: no abnormalities of mood noted, alert and oriented to situation  ED Results / Procedures / Treatments   Labs (all labs ordered are listed, but only abnormal results are displayed) Labs Reviewed  BASIC METABOLIC PANEL - Abnormal; Notable for the following components:      Result Value   Glucose, Bld 110 (*)    Calcium 8.3 (*)    All other components within normal limits  CBC - Abnormal; Notable for the following components:   RBC 5.29 (*)    MCV 75.0 (*)    MCH 23.1 (*)    RDW 18.2 (*)    All other components within normal limits  POC SARS CORONAVIRUS 2 AG -  ED - Abnormal; Notable for the following components:   SARS Coronavirus 2 Ag POSITIVE (*)    All other components within normal limits  COMPREHENSIVE METABOLIC PANEL  I-STAT BETA HCG BLOOD, ED ([facility], [facility], [facility] ONLY)    EKG EKG Interpretation  Date/Time:  Tuesday September 07 2019 12:25:49 EST Ventricular Rate:  93 PR Interval:  134 QRS Duration: 84 QT Interval:  344 QTC Calculation: 427 R Axis:   39 Text Interpretation: Normal sinus rhythm Nonspecific ST abnormality Abnormal ECG Interpretation limited secondary to artifact No previous ECGs available Confirmed by 05-09-2001 (Zadie Rhine) on 09/08/2019 1:16:51 AM   Radiology DG Chest 1 View  Result Date: 09/07/2019 CLINICAL DATA:  Pt from home with ems with worsening COVID symptoms for the past week: SOB, cough and fever. Daughter and husband are both COVID positive. Pt is on 4L of O2. Pt hx asthma. Pt is a former smoker EXAM: CHEST  1 VIEW COMPARISON:  None. FINDINGS: Linear opacities are noted at both lung bases. Mild hazy opacity noted at the left lung base. Mid and upper lungs are clear. No pleural effusion.  No pneumothorax. Cardiac silhouette is normal in size. No mediastinal or hilar masses or  convincing adenopathy. Skeletal structures are grossly intact. IMPRESSION: 1. Linear lung base opacities are noted bilaterally that likely due to scarring or atelectasis. Mild hazy opacity at the left lung base may reflect infection. No other evidence of acute cardiopulmonary disease. Electronically Signed   By: 09/09/2019 M.D.   On: 09/07/2019 13:09    Procedures Procedures   Medications Ordered in ED Medications  albuterol (VENTOLIN HFA) 108 (90 Base) MCG/ACT inhaler 2 puff (2 puffs Inhalation Given 09/08/19 0203)  dexamethasone (DECADRON) injection 6 mg (6 mg Intravenous Given 09/08/19 0320)    ED Course  I have reviewed the triage vital signs and the nursing notes.  Pertinent labs & imaging results that were available during my care of the patient were reviewed by me and considered in my medical decision making (see  chart for details).    MDM Rules/Calculators/A&P                      3:56 AM Patient with recent exposure to COVID-19.  She has been having symptoms since December 7 She reports her shortness of breath is worsening with dyspnea on exertion.  COVID-19 testing positive.  Room air pulse ox is around 91 to 92%.  She is currently on 2 L nasal cannula with improvement. Due to oxygen dependence as [name] as history of asthma will admit to the hospital.  IV Decadron/remdesivir has been ordered 4:22 AM D/w dr Loney Lohrathore for admission   Kristina [name] was evaluated in Emergency Department on 09/08/2019 for the symptoms described in the history of present illness. She was evaluated in the context of the global COVID-19 pandemic, which necessitated consideration that the patient might be at risk for infection with the SARS-CoV-2 virus that causes COVID-19. Institutional protocols and algorithms that pertain to the evaluation of patients at risk for COVID-19 are in a state of rapid change based on information released by regulatory bodies including the CDC and federal and state  organizations. These policies and algorithms were followed during the patient's care in the ED.  Final Clinical Impression(s) / ED Diagnoses Final diagnoses:  Pneumonia due to COVID-19 virus    Rx / DC Orders ED Discharge Orders    None       Zadie RhineWickline, Kiernan Atkerson, MD 09/08/19 [phone]

## 2019-09-09 NOTE — Plan of Care (Signed)
  Problem: Education: Goal: Knowledge of General Education information will improve Description: Including pain rating scale, medication(s)/side effects and non-pharmacologic comfort measures 09/09/2019 1133 by Orvan Falconer, RN Outcome: Progressing 09/09/2019 1133 by Orvan Falconer, RN Outcome: Progressing   Problem: Health Behavior/Discharge Planning: Goal: Ability to manage health-related needs will improve 09/09/2019 1133 by Orvan Falconer, RN Outcome: Progressing 09/09/2019 1133 by Orvan Falconer, RN Outcome: Progressing   Problem: Clinical Measurements: Goal: Ability to maintain clinical measurements within normal limits will improve 09/09/2019 1133 by Orvan Falconer, RN Outcome: Progressing 09/09/2019 1133 by Orvan Falconer, RN Outcome: Progressing Goal: Will remain free from infection 09/09/2019 1133 by Orvan Falconer, RN Outcome: Progressing 09/09/2019 1133 by Orvan Falconer, RN Outcome: Progressing Goal: Diagnostic test results will improve 09/09/2019 1133 by Orvan Falconer, RN Outcome: Progressing 09/09/2019 1133 by Orvan Falconer, RN Outcome: Progressing Goal: Respiratory complications will improve 09/09/2019 1133 by Orvan Falconer, RN Outcome: Progressing 09/09/2019 1133 by Orvan Falconer, RN Outcome: Progressing Goal: Cardiovascular complication will be avoided 09/09/2019 1133 by Orvan Falconer, RN Outcome: Progressing 09/09/2019 1133 by Orvan Falconer, RN Outcome: Progressing   Problem: Activity: Goal: Risk for activity intolerance will decrease 09/09/2019 1133 by Orvan Falconer, RN Outcome: Progressing 09/09/2019 1133 by Orvan Falconer, RN Outcome: Progressing   Problem: Nutrition: Goal: Adequate nutrition will be maintained 09/09/2019 1133 by Orvan Falconer, RN Outcome: Progressing 09/09/2019 1133 by Orvan Falconer, RN Outcome: Progressing   Problem: Coping: Goal: Level  of anxiety will decrease 09/09/2019 1133 by Orvan Falconer, RN Outcome: Progressing 09/09/2019 1133 by Orvan Falconer, RN Outcome: Progressing   Problem: Pain Managment: Goal: General experience of comfort will improve 09/09/2019 1133 by Orvan Falconer, RN Outcome: Progressing 09/09/2019 1133 by Orvan Falconer, RN Outcome: Progressing   Problem: Safety: Goal: Ability to remain free from injury will improve 09/09/2019 1133 by Orvan Falconer, RN Outcome: Progressing 09/09/2019 1133 by Orvan Falconer, RN Outcome: Progressing

## 2019-09-09 NOTE — Progress Notes (Signed)
Pt. has arrived as direct transfer.  MD page.

## 2019-09-09 NOTE — Progress Notes (Signed)
PROGRESS NOTE    Kristina [name]  BPZ:[id number] DOB: 1971-04-30 DOA: 09/07/2019 PCP: Ernestene Kiel, MD   Brief Narrative:  Kristina [name] is a 48 y.o. female with medical history significant of asthma, hypothyroidism presenting with complaints of shortness of breath, cough, and fever.  Patient states her husband and daughter recently tested positive for COVID-19.  Her symptoms started 9 days ago. She was having fevers for the first few days of her illness which have now stopped.  However, shortness of breath and cough have been getting progressively worse.  Denies fatigue, body aches, or loss of taste/smell.  Denies nausea, vomiting, diarrhea, or abdominal pain. In ED: Rapid SARS-CoV-2 antigen test positive.  Oxygen saturation 91% on room air, improved with 2 L supplemental oxygen.  Afebrile and no leukocytosis.  LFTs normal.  Chest x-ray (personally reviewed) showing linear opacities at both lung bases and mild hazy opacity at the left lung base. Patient received IV Decadron 6 mg and was started on remdesivir.   Subjective: No acute issues or events overnight, patient feels moderately improved since admission although not yet back to baseline.  Admits to ongoing shortness of breath worse with exertion but otherwise declines chest pain, nausea, vomiting, diarrhea, constipation, headache, fevers, chills.  Assessment & Plan:   Principal Problem:   Pneumonia due to COVID-19 virus Active Problems:   Moderate persistent asthma   Acute respiratory failure with hypoxia (HCC)   Hypokalemia  Acute hypoxic respiratory failure secondary to COVID-19 viral pneumonia, POA SpO2: 94 % O2 Flow Rate (L/min): 4 L/min Recent Labs    09/08/19 0553  DDIMER 0.46  FERRITIN 41  LDH 214*  CRP 13.9*  -Chest x-ray (personally reviewed) showing scant bibasilar patchy opacifications -Continue Remdesivir x5 days (last dose 12/20); IV Decadron 6 mg daily x10 days -Supportive care: Vitamin C,  zinc, vitamin D3, Antitussive as needed, Tylenol as needed -Procalcitonin level negative -Continue to encourage proning as tolerated, incentive spirometry, flutter, early ambulation  Asthma, history of, not in acute exacerbation -Continue inhaler, currently on steroids as above  Mild hypokalemia -Repleted, magnesium within normal limits   DVT prophylaxis: Lovenox Code Status: Full code Family Communication:  Updated over the phone Disposition Plan:  Patient remains inpatient status requiring submental oxygen IV medications and close monitoring in the setting of COVID-19 pneumonia.  Pending clinical improvement likely disposition home at this point ending other findings or needs that may arise.  Antimicrobials:  Remdesivir 09/08/19-09/12/19   Objective: Vitals:   09/08/19 2330 09/09/19 0030 09/09/19 0216 09/09/19 0259  BP: 132/77 124/79 128/74   Pulse: 79 80 73   Resp: 16 (!) 22 18   Temp:   98.7 F (37.1 C)   TempSrc:   Oral   SpO2: 94% 94% 94%   Weight:    78.2 kg  Height:    5\' 5"  (1.651 m)    Intake/Output Summary (Last 24 hours) at 09/09/2019 0742 Last data filed at 09/08/2019 1200 Gross per 24 hour  Intake --  Output 150 ml  Net -150 ml   Filed Weights   09/07/19 1221 09/09/19 0259  Weight: 79.4 kg 78.2 kg    Examination:  General:  Pleasantly resting in bed, No acute distress. HEENT:  Normocephalic atraumatic.  Sclerae nonicteric, noninjected.  Extraocular movements intact bilaterally. Neck:  Without mass or deformity.  Trachea is midline. Lungs:  Clear to auscultate bilaterally without rhonchi, wheeze, or rales. Heart:  Regular rate and rhythm.  Without murmurs, rubs, or gallops.  Abdomen:  Soft, nontender, nondistended.  Without guarding or rebound. Extremities: Without cyanosis, clubbing, edema, or obvious deformity. Vascular:  Dorsalis pedis and posterior tibial pulses palpable bilaterally. Skin:  Warm and dry, no erythema, no  ulcerations.     Data Reviewed: I have personally reviewed following labs and imaging studies  CBC: Recent Labs  Lab 09/07/19 1232  WBC 8.3  HGB 12.2  HCT 39.7  MCV 75.0*  PLT 227   Basic Metabolic Panel: Recent Labs  Lab 09/07/19 1232 09/08/19 0409 09/08/19 0553  NA 139 140  --   K 3.6 3.4*  --   CL 105 103  --   CO2 22 24  --   GLUCOSE 110* 115*  --   BUN 8 14  --   CREATININE 0.87 0.93  --   CALCIUM 8.3* 8.2*  --   MG  --   --  2.2   GFR: Estimated Creatinine Clearance: 74.8 mL/min (by C-G formula based on SCr of 0.93 mg/dL). Liver Function Tests: Recent Labs  Lab 09/08/19 0409  AST 27  ALT 23  ALKPHOS 79  BILITOT 0.4  PROT 6.9  ALBUMIN 2.8*   No results for input(s): LIPASE, AMYLASE in the last 168 hours. No results for input(s): AMMONIA in the last 168 hours. Coagulation Profile: No results for input(s): INR, PROTIME in the last 168 hours. Cardiac Enzymes: No results for input(s): CKTOTAL, CKMB, CKMBINDEX, TROPONINI in the last 168 hours. BNP (last 3 results) No results for input(s): PROBNP in the last 8760 hours. HbA1C: No results for input(s): HGBA1C in the last 72 hours. CBG: No results for input(s): GLUCAP in the last 168 hours. Lipid Profile: No results for input(s): CHOL, HDL, LDLCALC, TRIG, CHOLHDL, LDLDIRECT in the last 72 hours. Thyroid Function Tests: No results for input(s): TSH, T4TOTAL, FREET4, T3FREE, THYROIDAB in the last 72 hours. Anemia Panel: Recent Labs    09/08/19 0553  FERRITIN 41   Sepsis Labs: Recent Labs  Lab 09/08/19 0553  PROCALCITON <0.10    No results found for this or any previous visit (from the past 240 hour(s)).       Radiology Studies: DG Chest 1 View  Result Date: 09/07/2019 CLINICAL DATA:  Pt from home with ems with worsening COVID symptoms for the past week: SOB, cough and fever. Daughter and husband are both COVID positive. Pt is on 4L of O2. Pt hx asthma. Pt is a former smoker EXAM: CHEST   1 VIEW COMPARISON:  None. FINDINGS: Linear opacities are noted at both lung bases. Mild hazy opacity noted at the left lung base. Mid and upper lungs are clear. No pleural effusion.  No pneumothorax. Cardiac silhouette is normal in size. No mediastinal or hilar masses or convincing adenopathy. Skeletal structures are grossly intact. IMPRESSION: 1. Linear lung base opacities are noted bilaterally that likely due to scarring or atelectasis. Mild hazy opacity at the left lung base may reflect infection. No other evidence of acute cardiopulmonary disease. Electronically Signed   By: Amie Portlandavid  Ormond M.D.   On: 09/07/2019 13:09        Scheduled Meds: . vitamin C  500 mg Oral Daily  . cholecalciferol  2,000 Units Oral Daily  . dexamethasone (DECADRON) injection  6 mg Intravenous Q24H  . enoxaparin (LOVENOX) injection  40 mg Subcutaneous Q24H  . zinc sulfate  220 mg Oral Daily   Continuous Infusions: . remdesivir 100 mg in NS 100 mL       LOS: 1  day    Time spent:    Azucena Fallen, DO [facility]  If 7PM-7AM, please contact night-coverage [email or web address] Password [facility] 09/09/2019, 7:42 AM

## 2019-09-10 LAB — COMPREHENSIVE METABOLIC PANEL
ALT: 36 U/L (ref 0–44)
AST: 23 U/L (ref 15–41)
Albumin: 2.9 g/dL — ABNORMAL LOW (ref 3.5–5.0)
Alkaline Phosphatase: 72 U/L (ref 38–126)
Anion gap: 9 (ref 5–15)
BUN: 23 mg/dL — ABNORMAL HIGH (ref 6–20)
CO2: 24 mmol/L (ref 22–32)
Calcium: 8.7 mg/dL — ABNORMAL LOW (ref 8.9–10.3)
Chloride: 107 mmol/L (ref 98–111)
Creatinine, Ser: 0.63 mg/dL (ref 0.44–1.00)
GFR calc Af Amer: >60 mL/min (ref >60)
GFR calc non Af Amer: >60 mL/min (ref >60)
Glucose, Bld: 96 mg/dL (ref 70–99)
Potassium: 4 mmol/L (ref 3.5–5.1)
Sodium: 140 mmol/L (ref 135–145)
Total Bilirubin: 0.6 mg/dL (ref 0.3–1.2)
Total Protein: 6.6 g/dL (ref 6.5–8.1)

## 2019-09-10 LAB — CBC WITH DIFFERENTIAL/PLATELET
Abs Immature Granulocytes: 0.16 10*3/uL — ABNORMAL HIGH (ref 0.00–0.07)
Basophils Absolute: 0 10*3/uL (ref 0.0–0.1)
Basophils Relative: 0 %
Eosinophils Absolute: 0 10*3/uL (ref 0.0–0.5)
Eosinophils Relative: 0 %
HCT: 38.2 % (ref 36.0–46.0)
Hemoglobin: 11.4 g/dL — ABNORMAL LOW (ref 12.0–15.0)
Immature Granulocytes: 2 %
Lymphocytes Relative: 23 %
Lymphs Abs: 2.3 10*3/uL (ref 0.7–4.0)
MCH: 22.6 pg — ABNORMAL LOW (ref 26.0–34.0)
MCHC: 29.8 g/dL — ABNORMAL LOW (ref 30.0–36.0)
MCV: 75.6 fL — ABNORMAL LOW (ref 80.0–100.0)
Monocytes Absolute: 0.7 10*3/uL (ref 0.1–1.0)
Monocytes Relative: 7 %
Neutro Abs: 6.6 10*3/uL (ref 1.7–7.7)
Neutrophils Relative %: 68 %
Platelets: 342 10*3/uL (ref 150–400)
RBC: 5.05 MIL/uL (ref 3.87–5.11)
RDW: 18.7 % — ABNORMAL HIGH (ref 11.5–15.5)
WBC: 9.7 10*3/uL (ref 4.0–10.5)
nRBC: 0 % (ref 0.0–0.2)

## 2019-09-10 LAB — C-REACTIVE PROTEIN: CRP: 2.5 mg/dL — ABNORMAL HIGH (ref <1.0)

## 2019-09-10 LAB — D-DIMER, QUANTITATIVE: D-Dimer, Quant: 0.44 ug/mL-FEU (ref 0.00–0.50)

## 2019-09-10 LAB — LACTATE DEHYDROGENASE: LDH: 156 U/L (ref 98–192)

## 2019-09-10 NOTE — Plan of Care (Signed)
  Problem: Education: Goal: Knowledge of General Education information will improve Description: Including pain rating scale, medication(s)/side effects and non-pharmacologic comfort measures 09/10/2019 0845 by Orvan Falconer, RN Outcome: Progressing 09/10/2019 0845 by Orvan Falconer, RN Outcome: Progressing   Problem: Health Behavior/Discharge Planning: Goal: Ability to manage health-related needs will improve 09/10/2019 0845 by Orvan Falconer, RN Outcome: Progressing 09/10/2019 0845 by Orvan Falconer, RN Outcome: Progressing   Problem: Clinical Measurements: Goal: Ability to maintain clinical measurements within normal limits will improve 09/10/2019 0845 by Orvan Falconer, RN Outcome: Progressing 09/10/2019 0845 by Orvan Falconer, RN Outcome: Progressing Goal: Will remain free from infection 09/10/2019 0845 by Orvan Falconer, RN Outcome: Progressing 09/10/2019 0845 by Orvan Falconer, RN Outcome: Progressing Goal: Diagnostic test results will improve 09/10/2019 0845 by Orvan Falconer, RN Outcome: Progressing 09/10/2019 0845 by Orvan Falconer, RN Outcome: Progressing Goal: Respiratory complications will improve 09/10/2019 0845 by Orvan Falconer, RN Outcome: Progressing 09/10/2019 0845 by Orvan Falconer, RN Outcome: Progressing Goal: Cardiovascular complication will be avoided 09/10/2019 0845 by Orvan Falconer, RN Outcome: Progressing 09/10/2019 0845 by Orvan Falconer, RN Outcome: Progressing   Problem: Activity: Goal: Risk for activity intolerance will decrease 09/10/2019 0845 by Orvan Falconer, RN Outcome: Progressing 09/10/2019 0845 by Orvan Falconer, RN Outcome: Progressing   Problem: Nutrition: Goal: Adequate nutrition will be maintained 09/10/2019 0845 by Orvan Falconer, RN Outcome: Progressing 09/10/2019 0845 by Orvan Falconer, RN Outcome: Progressing   Problem: Coping: Goal: Level  of anxiety will decrease 09/10/2019 0845 by Orvan Falconer, RN Outcome: Progressing 09/10/2019 0845 by Orvan Falconer, RN Outcome: Progressing   Problem: Pain Managment: Goal: General experience of comfort will improve 09/10/2019 0845 by Orvan Falconer, RN Outcome: Progressing 09/10/2019 0845 by Orvan Falconer, RN Outcome: Progressing   Problem: Safety: Goal: Ability to remain free from injury will improve 09/10/2019 0845 by Orvan Falconer, RN Outcome: Progressing 09/10/2019 0845 by Orvan Falconer, RN Outcome: Progressing

## 2019-09-10 NOTE — Progress Notes (Signed)
PROGRESS NOTE    Kristina ChickShannon Laprad Loser  WUJ:[id number] DOB: 06/02/1971 DOA: 09/07/2019 PCP: Philemon KingdomProchnau, Caroline, MD   Brief Narrative:  Kristina [name] is a 48 y.o. female with medical history significant of asthma, hypothyroidism presenting with complaints of shortness of breath, cough, and fever.  Patient states her husband and daughter recently tested positive for COVID-19.  Her symptoms started 9 days ago. She was having fevers for the first few days of her illness which have now stopped.  However, shortness of breath and cough have been getting progressively worse.  Denies fatigue, body aches, or loss of taste/smell.  Denies nausea, vomiting, diarrhea, or abdominal pain. In ED: Rapid SARS-CoV-2 antigen test positive.  Oxygen saturation 91% on room air, improved with 2 L supplemental oxygen.  Afebrile and no leukocytosis.  LFTs normal.  Chest x-ray (personally reviewed) showing linear opacities at both lung bases and mild hazy opacity at the left lung base. Patient received IV Decadron 6 mg and was started on remdesivir.  Subjective: No acute issues or events overnight, patient feels markedly improved since admission although not yet back to baseline and still requiring supplemental oxygen.  Admits to ongoing dyspnea with exertion but otherwise declines chest pain, nausea, vomiting, diarrhea, constipation, headache, fevers, chills.  Assessment & Plan:   Principal Problem:   Pneumonia due to COVID-19 virus Active Problems:   Moderate persistent asthma   Acute respiratory failure with hypoxia (HCC)   Hypokalemia  Acute hypoxic respiratory failure secondary to COVID-19 viral pneumonia, POA SpO2: 94 % O2 Flow Rate (L/min): 2 L/min Recent Labs    09/08/19 0553 09/10/19 0336  DDIMER 0.46 0.44  FERRITIN 41  --   LDH 214* 156  CRP 13.9* 2.5*  -Chest x-ray (personally reviewed) showing scant bibasilar patchy opacifications -Continue Remdesivir x5 days (last dose 12/20); IV Decadron 6  mg daily x10 days -Supportive care: Vitamin C, zinc, vitamin D3, Antitussive as needed, Tylenol as needed -Procalcitonin level negative -Continue to encourage proning as tolerated, incentive spirometry, flutter, early ambulation  Asthma, history of, not in acute exacerbation -Continue inhaler, currently on steroids as above  Mild hypokalemia,resolved -Follow daily labs -Mg WNL   DVT prophylaxis: Lovenox Code Status: Full code Family Communication:  Updated over the phone Disposition Plan:  Patient remains inpatient status requiring submental oxygen IV medications and close monitoring in the setting of COVID-19 pneumonia.  Pending clinical improvement likely disposition home at this point ending other findings or needs that may arise.  Antimicrobials:  Remdesivir 09/08/19-09/12/19   Objective: Vitals:   09/10/19 0002 09/10/19 0515 09/10/19 0730 09/10/19 1200  BP: (!) 151/85 128/68 119/67 124/79  Pulse: 71 71  85  Resp: (!) 23   18  Temp: 98.8 F (37.1 C)  98.5 F (36.9 C) (!) 97.5 F (36.4 C)  TempSrc: Oral  Oral Oral  SpO2: 95% 94%    Weight:      Height:        Intake/Output Summary (Last 24 hours) at 09/10/2019 1320 Last data filed at 09/10/2019 1230 Gross per 24 hour  Intake 468.72 ml  Output 1500 ml  Net -1031.28 ml   Filed Weights   09/07/19 1221 09/09/19 0259  Weight: 79.4 kg 78.2 kg    Examination:  General:  Pleasantly resting in bed, No acute distress. HEENT:  Normocephalic atraumatic.  Sclerae nonicteric, noninjected.  Extraocular movements intact bilaterally. Neck:  Without mass or deformity.  Trachea is midline. Lungs:  Clear to auscultate bilaterally without rhonchi, wheeze,  or rales. Heart:  Regular rate and rhythm.  Without murmurs, rubs, or gallops. Abdomen:  Soft, nontender, nondistended.  Without guarding or rebound. Extremities: Without cyanosis, clubbing, edema, or obvious deformity. Vascular:  Dorsalis pedis and posterior tibial  pulses palpable bilaterally. Skin:  Warm and dry, no erythema, no ulcerations.     Data Reviewed: I have personally reviewed following labs and imaging studies  CBC: Recent Labs  Lab 09/07/19 1232 09/10/19 0336  WBC 8.3 9.7  NEUTROABS  --  6.6  HGB 12.2 11.4*  HCT 39.7 38.2  MCV 75.0* 75.6*  PLT 227 342   Basic Metabolic Panel: Recent Labs  Lab 09/07/19 1232 09/08/19 0409 09/08/19 0553 09/10/19 0336  NA 139 140  --  140  K 3.6 3.4*  --  4.0  CL 105 103  --  107  CO2 22 24  --  24  GLUCOSE 110* 115*  --  96  BUN 8 14  --  23*  CREATININE 0.87 0.93  --  0.63  CALCIUM 8.3* 8.2*  --  8.7*  MG  --   --  2.2  --    GFR: Estimated Creatinine Clearance: 87 mL/min (by C-G formula based on SCr of 0.63 mg/dL). Liver Function Tests: Recent Labs  Lab 09/08/19 0409 09/10/19 0336  AST 27 23  ALT 23 36  ALKPHOS 79 72  BILITOT 0.4 0.6  PROT 6.9 6.6  ALBUMIN 2.8* 2.9*   No results for input(s): LIPASE, AMYLASE in the last 168 hours. No results for input(s): AMMONIA in the last 168 hours. Coagulation Profile: No results for input(s): INR, PROTIME in the last 168 hours. Cardiac Enzymes: No results for input(s): CKTOTAL, CKMB, CKMBINDEX, TROPONINI in the last 168 hours. BNP (last 3 results) No results for input(s): PROBNP in the last 8760 hours. HbA1C: No results for input(s): HGBA1C in the last 72 hours. CBG: No results for input(s): GLUCAP in the last 168 hours. Lipid Profile: No results for input(s): CHOL, HDL, LDLCALC, TRIG, CHOLHDL, LDLDIRECT in the last 72 hours. Thyroid Function Tests: No results for input(s): TSH, T4TOTAL, FREET4, T3FREE, THYROIDAB in the last 72 hours. Anemia Panel: Recent Labs    09/08/19 0553  FERRITIN 41   Sepsis Labs: Recent Labs  Lab 09/08/19 0553  PROCALCITON <0.10    Recent Results (from the past 240 hour(s))  Culture, blood (routine x 2)     Status: None (Preliminary result)   Collection Time: 09/08/19  6:55 AM    Specimen: BLOOD  Result Value Ref Range Status   Specimen Description BLOOD RIGHT ANTECUBITAL  Final   Special Requests   Final    BOTTLES DRAWN AEROBIC AND ANAEROBIC Blood Culture results may not be optimal due to an inadequate volume of blood received in culture bottles   Culture   Final    NO GROWTH 1 DAY Performed at [facility], [address], [place], [place] [address]    Report Status PENDING  Incomplete  Culture, blood (routine x 2)     Status: None (Preliminary result)   Collection Time: 09/08/19  6:55 AM   Specimen: BLOOD RIGHT HAND  Result Value Ref Range Status   Specimen Description BLOOD RIGHT HAND  Final   Special Requests   Final    BOTTLES DRAWN AEROBIC AND ANAEROBIC Blood Culture results may not be optimal due to an inadequate volume of blood received in culture bottles   Culture   Final    NO GROWTH  1 DAY Performed at [facility], [place] [address], [place], [place] [address]    Report Status PENDING  Incomplete         Radiology Studies: No results found.      Scheduled Meds: . vitamin C  500 mg Oral Daily  . cholecalciferol  2,000 Units Oral Daily  . dexamethasone (DECADRON) injection  6 mg Intravenous Q24H  . enoxaparin (LOVENOX) injection  40 mg Subcutaneous Q24H  . zinc sulfate  220 mg Oral Daily   Continuous Infusions: . remdesivir 100 mg in NS 100 mL 100 mg (09/10/19 0845)     LOS: 2 days   Time spent: 35min  Shanzay Hepworth C Canio Winokur, DO [facility]  If 7PM-7AM, please contact night-coverage [email or web address] Password [email or web address] 09/10/2019, 1:20 PM

## 2019-09-10 NOTE — TOC Initial Note (Signed)
Transition of Care [facility]) - Initial/Assessment Note    Patient Details  Name: Kristina [name] MRN: [id number] Date of Birth: Mar 09, 1971  [facility] CM/SW Contact:    Ninfa Meeker, RN Phone Number: 09/10/2019, 10:26 AM  Clinical Narrative:  48 yr old female admitted for treatment of COVID 19. At this time patient has no needs identified. CM will continue to monitor.                        Patient Goals and CMS Choice        Expected Discharge Plan and Services                                                Prior Living Arrangements/Services                       Activities of Daily Living Home Assistive Devices/Equipment: None ADL Screening (condition at time of admission) Patient's cognitive ability adequate to safely complete daily activities?: Yes Is the patient deaf or have difficulty hearing?: No Does the patient have difficulty seeing, even when wearing glasses/contacts?: No Does the patient have difficulty concentrating, remembering, or making decisions?: No Patient able to express need for assistance with ADLs?: Yes Does the patient have difficulty dressing or bathing?: No Independently performs ADLs?: Yes (appropriate for developmental age) Does the patient have difficulty walking or climbing stairs?: No Weakness of Legs: Both Weakness of Arms/Hands: None  Permission Sought/Granted                  Emotional Assessment              Admission diagnosis:  Cough [R05] Pneumonia due to COVID-19 virus [U07.1, J12.89] Patient Active Problem List   Diagnosis Date Noted  . Pneumonia due to COVID-19 virus 09/08/2019  . Acute respiratory failure with hypoxia ([place]) 09/08/2019  . Hypokalemia 09/08/2019  . Moderate persistent asthma 06/22/2015  . Other allergic rhinitis 06/22/2015  . Laryngopharyngeal reflux (LPR) 06/22/2015   PCP:  Ernestene Kiel, MD Pharmacy:   [facility] [address] [place] [address] Phone: [phone] Fax: [phone]     Social Determinants of Health (SDOH) Interventions    Readmission Risk Interventions No flowsheet data found.

## 2019-09-11 LAB — CBC WITH DIFFERENTIAL/PLATELET
Abs Immature Granulocytes: 0.32 10*3/uL — ABNORMAL HIGH (ref 0.00–0.07)
Basophils Absolute: 0.1 10*3/uL (ref 0.0–0.1)
Basophils Relative: 0 %
Eosinophils Absolute: 0 10*3/uL (ref 0.0–0.5)
Eosinophils Relative: 0 %
HCT: 38.4 % (ref 36.0–46.0)
Hemoglobin: 11.6 g/dL — ABNORMAL LOW (ref 12.0–15.0)
Immature Granulocytes: 3 %
Lymphocytes Relative: 29 %
Lymphs Abs: 3.2 10*3/uL (ref 0.7–4.0)
MCH: 22.8 pg — ABNORMAL LOW (ref 26.0–34.0)
MCHC: 30.2 g/dL (ref 30.0–36.0)
MCV: 75.6 fL — ABNORMAL LOW (ref 80.0–100.0)
Monocytes Absolute: 0.9 10*3/uL (ref 0.1–1.0)
Monocytes Relative: 8 %
Neutro Abs: 6.7 10*3/uL (ref 1.7–7.7)
Neutrophils Relative %: 60 %
Platelets: 367 10*3/uL (ref 150–400)
RBC: 5.08 MIL/uL (ref 3.87–5.11)
RDW: 18.3 % — ABNORMAL HIGH (ref 11.5–15.5)
WBC: 11.2 10*3/uL — ABNORMAL HIGH (ref 4.0–10.5)
nRBC: 0 % (ref 0.0–0.2)

## 2019-09-11 LAB — COMPREHENSIVE METABOLIC PANEL
ALT: 45 U/L — ABNORMAL HIGH (ref 0–44)
AST: 26 U/L (ref 15–41)
Albumin: 2.9 g/dL — ABNORMAL LOW (ref 3.5–5.0)
Alkaline Phosphatase: 72 U/L (ref 38–126)
Anion gap: 11 (ref 5–15)
BUN: 24 mg/dL — ABNORMAL HIGH (ref 6–20)
CO2: 23 mmol/L (ref 22–32)
Calcium: 8.6 mg/dL — ABNORMAL LOW (ref 8.9–10.3)
Chloride: 106 mmol/L (ref 98–111)
Creatinine, Ser: 0.7 mg/dL (ref 0.44–1.00)
GFR calc Af Amer: >60 mL/min (ref >60)
GFR calc non Af Amer: >60 mL/min (ref >60)
Glucose, Bld: 81 mg/dL (ref 70–99)
Potassium: 3.9 mmol/L (ref 3.5–5.1)
Sodium: 140 mmol/L (ref 135–145)
Total Bilirubin: 0.4 mg/dL (ref 0.3–1.2)
Total Protein: 6.3 g/dL — ABNORMAL LOW (ref 6.5–8.1)

## 2019-09-11 LAB — LACTATE DEHYDROGENASE: LDH: 150 U/L (ref 98–192)

## 2019-09-11 LAB — D-DIMER, QUANTITATIVE: D-Dimer, Quant: 0.35 ug/mL-FEU (ref 0.00–0.50)

## 2019-09-11 LAB — C-REACTIVE PROTEIN: CRP: 1.3 mg/dL — ABNORMAL HIGH (ref <1.0)

## 2019-09-11 MED ORDER — MENTHOL 3 MG MT LOZG
1.0000 | LOZENGE | OROMUCOSAL | Status: DC | PRN
  Filled 2019-09-11: qty 9

## 2019-09-11 NOTE — Progress Notes (Signed)
PROGRESS NOTE    Avalene Sealy  IRJ:[id number] DOB: 1971-01-23 DOA: 09/07/2019 PCP: Philemon Kingdom, MD   Brief Narrative:  Kristina [name] is a 48 y.o. female with medical history significant of asthma, hypothyroidism presenting with complaints of shortness of breath, cough, and fever.  Patient states her husband and daughter recently tested positive for COVID-19.  Her symptoms started 9 days ago. She was having fevers for the first few days of her illness which have now stopped.  However, shortness of breath and cough have been getting progressively worse.  Denies fatigue, body aches, or loss of taste/smell.  Denies nausea, vomiting, diarrhea, or abdominal pain. In ED: Rapid SARS-CoV-2 antigen test positive.  Oxygen saturation 91% on room air, improved with 2 L supplemental oxygen.  Afebrile and no leukocytosis.  LFTs normal.  Chest x-ray (personally reviewed) showing linear opacities at both lung bases and mild hazy opacity at the left lung base. Patient received IV Decadron 6 mg and was started on remdesivir.  Subjective: No acute issues or events overnight, feeling much improved since admission although not yet back to baseline, continues complain of minimal dyspnea with exertion.  Otherwise denies chest pain, nausea, vomiting, diarrhea, constipation, headache, fevers, chills.  Assessment & Plan:   Principal Problem:   Pneumonia due to COVID-19 virus Active Problems:   Moderate persistent asthma   Acute respiratory failure with hypoxia (HCC)   Hypokalemia  Acute hypoxic respiratory failure secondary to COVID-19 viral pneumonia, POA Patient currently on room air this morning at rest, continue ambulatory O2 screening daily Recent Labs    09/10/19 0336 09/11/19 0303  DDIMER 0.44 0.35  LDH 156 150  CRP 2.5* 1.3*  -Chest x-ray (personally reviewed) showing scant bibasilar patchy opacifications -Continue Remdesivir x5 days (last dose 12/20); IV Decadron 6 mg daily x10  days -Supportive care: Vitamin C, zinc, vitamin D3, Antitussive as needed, Tylenol as needed -Procalcitonin level negative -Continue to encourage proning as tolerated, incentive spirometry, flutter, early ambulation  Asthma, history of, not in acute exacerbation -Continue inhaler, currently on steroids as above  Mild hypokalemia,resolved -Follow daily labs -Mg WNL   DVT prophylaxis: Lovenox Code Status: Full code Family Communication:  Updated over the phone Disposition Plan:  Patient remains inpatient status requiring submental oxygen IV medications and close monitoring in the setting of COVID-19 pneumonia.  Pending clinical improvement likely disposition home at this point ending other findings or needs that may arise.  Antimicrobials:  Remdesivir 09/08/19-09/12/19   Objective: Vitals:   09/11/19 0200 09/11/19 0400 09/11/19 0500 09/11/19 0805  BP:   111/73 114/70  Pulse: 65 62 65 84  Resp: (!) 21 19 (!) 22 20  Temp:   98.4 F (36.9 C) 98.5 F (36.9 C)  TempSrc:   Oral Oral  SpO2: 94% 95% 92% 92%  Weight:      Height:        Intake/Output Summary (Last 24 hours) at 09/11/2019 1357 Last data filed at 09/11/2019 1000 Gross per 24 hour  Intake 330 ml  Output 1000 ml  Net -670 ml   Filed Weights   09/07/19 1221 09/09/19 0259  Weight: 79.4 kg 78.2 kg    Examination:  General:  Pleasantly resting in bed, No acute distress. HEENT:  Normocephalic atraumatic.  Sclerae nonicteric, noninjected.  Extraocular movements intact bilaterally. Neck:  Without mass or deformity.  Trachea is midline. Lungs:  Clear to auscultate bilaterally without rhonchi, wheeze, or rales. Heart:  Regular rate and rhythm.  Without murmurs,  rubs, or gallops. Abdomen:  Soft, nontender, nondistended.  Without guarding or rebound. Extremities: Without cyanosis, clubbing, edema, or obvious deformity. Vascular:  Dorsalis pedis and posterior tibial pulses palpable bilaterally. Skin:  Warm and  dry, no erythema, no ulcerations.     Data Reviewed: I have personally reviewed following labs and imaging studies  CBC: Recent Labs  Lab 09/07/19 1232 09/10/19 0336 09/11/19 0303  WBC 8.3 9.7 11.2*  NEUTROABS  --  6.6 6.7  HGB 12.2 11.4* 11.6*  HCT 39.7 38.2 38.4  MCV 75.0* 75.6* 75.6*  PLT 227 342 367   Basic Metabolic Panel: Recent Labs  Lab 09/07/19 1232 09/08/19 0409 09/08/19 0553 09/10/19 0336 09/11/19 0303  NA 139 140  --  140 140  K 3.6 3.4*  --  4.0 3.9  CL 105 103  --  107 106  CO2 22 24  --  24 23  GLUCOSE 110* 115*  --  96 81  BUN 8 14  --  23* 24*  CREATININE 0.87 0.93  --  0.63 0.70  CALCIUM 8.3* 8.2*  --  8.7* 8.6*  MG  --   --  2.2  --   --    GFR: Estimated Creatinine Clearance: 87 mL/min (by C-G formula based on SCr of 0.7 mg/dL). Liver Function Tests: Recent Labs  Lab 09/08/19 0409 09/10/19 0336 09/11/19 0303  AST 27 23 26   ALT 23 36 45*  ALKPHOS 79 72 72  BILITOT 0.4 0.6 0.4  PROT 6.9 6.6 6.3*  ALBUMIN 2.8* 2.9* 2.9*   No results for input(s): LIPASE, AMYLASE in the last 168 hours. No results for input(s): AMMONIA in the last 168 hours. Coagulation Profile: No results for input(s): INR, PROTIME in the last 168 hours. Cardiac Enzymes: No results for input(s): CKTOTAL, CKMB, CKMBINDEX, TROPONINI in the last 168 hours. BNP (last 3 results) No results for input(s): PROBNP in the last 8760 hours. HbA1C: No results for input(s): HGBA1C in the last 72 hours. CBG: No results for input(s): GLUCAP in the last 168 hours. Lipid Profile: No results for input(s): CHOL, HDL, LDLCALC, TRIG, CHOLHDL, LDLDIRECT in the last 72 hours. Thyroid Function Tests: No results for input(s): TSH, T4TOTAL, FREET4, T3FREE, THYROIDAB in the last 72 hours. Anemia Panel: No results for input(s): VITAMINB12, FOLATE, FERRITIN, TIBC, IRON, RETICCTPCT in the last 72 hours. Sepsis Labs: Recent Labs  Lab 09/08/19 0553  PROCALCITON <0.10    Recent Results  (from the past 240 hour(s))  Culture, blood (routine x 2)     Status: None (Preliminary result)   Collection Time: 09/08/19  6:55 AM   Specimen: BLOOD  Result Value Ref Range Status   Specimen Description BLOOD RIGHT ANTECUBITAL  Final   Special Requests   Final    BOTTLES DRAWN AEROBIC AND ANAEROBIC Blood Culture results may not be optimal due to an inadequate volume of blood received in culture bottles   Culture   Final    NO GROWTH 3 DAYS Performed at [facility], [address] [place], [place] [address]    Report Status PENDING  Incomplete  Culture, blood (routine x 2)     Status: None (Preliminary result)   Collection Time: 09/08/19  6:55 AM   Specimen: BLOOD RIGHT HAND  Result Value Ref Range Status   Specimen Description BLOOD RIGHT HAND  Final   Special Requests   Final    BOTTLES DRAWN AEROBIC AND ANAEROBIC Blood Culture results may not be optimal due  to an inadequate volume of blood received in culture bottles   Culture   Final    NO GROWTH 3 DAYS Performed at [facility], [place] [address], [place], [place] [address]    Report Status PENDING  Incomplete         Radiology Studies: No results found.   Scheduled Meds: . vitamin C  500 mg Oral Daily  . cholecalciferol  2,000 Units Oral Daily  . dexamethasone (DECADRON) injection  6 mg Intravenous Q24H  . enoxaparin (LOVENOX) injection  40 mg Subcutaneous Q24H  . zinc sulfate  220 mg Oral Daily   Continuous Infusions: . remdesivir 100 mg in NS 100 mL 100 mg (09/11/19 0941)     LOS: 3 days   Time spent: 46min  Kaylan Friedmann C Maxi Carreras, DO [facility]  If 7PM-7AM, please contact night-coverage [email or web address] Password [email or web address] 09/11/2019, 1:57 PM

## 2019-09-11 NOTE — Plan of Care (Signed)

## 2019-09-11 NOTE — Progress Notes (Signed)
Patient stated that her family does not need to be updated because she has already done so unless something changes

## 2019-09-12 LAB — CBC WITH DIFFERENTIAL/PLATELET
Abs Immature Granulocytes: 0.44 10*3/uL — ABNORMAL HIGH (ref 0.00–0.07)
Basophils Absolute: 0.1 10*3/uL (ref 0.0–0.1)
Basophils Relative: 0 %
Eosinophils Absolute: 0 10*3/uL (ref 0.0–0.5)
Eosinophils Relative: 0 %
HCT: 37.6 % (ref 36.0–46.0)
Hemoglobin: 11.5 g/dL — ABNORMAL LOW (ref 12.0–15.0)
Immature Granulocytes: 4 %
Lymphocytes Relative: 28 %
Lymphs Abs: 3.3 10*3/uL (ref 0.7–4.0)
MCH: 22.8 pg — ABNORMAL LOW (ref 26.0–34.0)
MCHC: 30.6 g/dL (ref 30.0–36.0)
MCV: 74.5 fL — ABNORMAL LOW (ref 80.0–100.0)
Monocytes Absolute: 1 10*3/uL (ref 0.1–1.0)
Monocytes Relative: 9 %
Neutro Abs: 6.8 10*3/uL (ref 1.7–7.7)
Neutrophils Relative %: 59 %
Platelets: 392 10*3/uL (ref 150–400)
RBC: 5.05 MIL/uL (ref 3.87–5.11)
RDW: 18.8 % — ABNORMAL HIGH (ref 11.5–15.5)
WBC: 11.6 10*3/uL — ABNORMAL HIGH (ref 4.0–10.5)
nRBC: 0 % (ref 0.0–0.2)

## 2019-09-12 LAB — COMPREHENSIVE METABOLIC PANEL
ALT: 39 U/L (ref 0–44)
AST: 20 U/L (ref 15–41)
Albumin: 2.8 g/dL — ABNORMAL LOW (ref 3.5–5.0)
Alkaline Phosphatase: 74 U/L (ref 38–126)
Anion gap: 9 (ref 5–15)
BUN: 22 mg/dL — ABNORMAL HIGH (ref 6–20)
CO2: 23 mmol/L (ref 22–32)
Calcium: 8.5 mg/dL — ABNORMAL LOW (ref 8.9–10.3)
Chloride: 108 mmol/L (ref 98–111)
Creatinine, Ser: 0.72 mg/dL (ref 0.44–1.00)
GFR calc Af Amer: >60 mL/min (ref >60)
GFR calc non Af Amer: >60 mL/min (ref >60)
Glucose, Bld: 94 mg/dL (ref 70–99)
Potassium: 4.2 mmol/L (ref 3.5–5.1)
Sodium: 140 mmol/L (ref 135–145)
Total Bilirubin: 0.4 mg/dL (ref 0.3–1.2)
Total Protein: 6.2 g/dL — ABNORMAL LOW (ref 6.5–8.1)

## 2019-09-12 LAB — LACTATE DEHYDROGENASE: LDH: 171 U/L (ref 98–192)

## 2019-09-12 LAB — C-REACTIVE PROTEIN: CRP: 0.8 mg/dL (ref <1.0)

## 2019-09-12 LAB — D-DIMER, QUANTITATIVE: D-Dimer, Quant: 0.27 ug/mL-FEU (ref 0.00–0.50)

## 2019-09-12 MED ORDER — MENTHOL 3 MG MT LOZG: 1.0000 | LOZENGE | OROMUCOSAL | 12 refills | Status: DC | PRN

## 2019-09-12 MED ORDER — PREDNISONE 10 MG PO TABS: ORAL_TABLET | ORAL | 0 refills | Status: AC

## 2019-09-12 NOTE — Discharge Planning (Addendum)
Patient IV removed.  Received last dose Remdesivir prior to DC.  RN assessment and VS revealed stability for DC to home w/ family.  Discussed importance of continued quarantine for 2 weeks beyond DC and to carefully monitor self and others in the home for increased s/sx of covid. Signed DC contract and placed on chart. Told of need for PCP appt in 2 weeks and of script sent to pharm.  Given home pulse ox to assist with monitoring.  Once ready, will be wheeled to front and family transporting home via car.

## 2019-09-12 NOTE — Discharge Summary (Signed)
Physician Discharge Summary  Lorenza ChickShannon [name] [place]lin ZOX:[id number] DOB: 09/09/1971 DOA: 09/07/2019  PCP: Philemon KingdomProchnau, Caroline, MD  Admit date: 09/07/2019 Discharge date: 09/12/2019  Admitted From: Home Disposition: Home  Recommendations for Outpatient Follow-up:  1. Follow up with PCP in 1-2 weeks 2. Please obtain BMP/CBC in one week  Home Health: Not indicated Equipment/Devices: None  Discharge Condition: Stable CODE STATUS: Full Diet recommendation: As tolerated  Brief/Interim Summary: Kristina HerterShannon [name] Bowenis a 48 y.o.femalewith medical history significant ofasthma, hypothyroidism presenting with complaints of shortness of breath, cough, and fever.Patient states her husband and daughter recently tested positive for COVID-19. Her symptoms started 9 days ago. She was having fevers for the first few days of her illness which have now stopped. However, shortness of breath and cough have been getting progressively worse. Denies fatigue, body aches, or loss of taste/smell. Denies nausea, vomiting, diarrhea, or abdominal pain. In ED: Rapid SARS-CoV-2 antigen test positive. Oxygen saturation 91% on room air, improved with 2 L supplemental oxygen. Afebrile and no leukocytosis. LFTs normal. Chest x-ray (personally reviewed) showing linear opacities at both lung bases and mild hazy opacity at the left lung base. Patient received IV Decadron 6 mg and was started on remdesivir.  Patient admitted as above with acute hypoxic respiratory failure in the setting of COVID-19 pneumonia with known history of asthma placing patient at high risk for decompensation.  Patient received remdesivir, Decadron early in her clinical course and improved drastically over the past 5 days.  Patient has now been on room air for 48 hours, ambulating without any difficulty dyspnea or hypoxia.  Patient does have lingering cough, this is well controlled on cough suppressant syrup and lozenges.  Patient will need close  follow-up with PCP in the next 1 to 2 weeks for further evaluation and treatment.  Patient will be discharged on remainder of steroid course but is otherwise stable and agreeable for discharge home.  Patient educated to quarantine as discussed for 21 days since her positive swab.  If patient has any Covid negative family members at home the need to quarantine and stay away from each other for this duration as well.  Discharge Diagnoses:  Principal Problem:   Pneumonia due to COVID-19 virus Active Problems:   Moderate persistent asthma   Acute respiratory failure with hypoxia (HCC)   Hypokalemia    Discharge Instructions  Discharge Instructions    Call MD for:  difficulty breathing, headache or visual disturbances   Complete by: As directed    Call MD for:  extreme fatigue   Complete by: As directed    Call MD for:  hives   Complete by: As directed    Call MD for:  persistant dizziness or light-headedness   Complete by: As directed    Call MD for:  persistant nausea and vomiting   Complete by: As directed    Call MD for:  severe uncontrolled pain   Complete by: As directed    Call MD for:  temperature >100.4   Complete by: As directed    Diet - low sodium heart healthy   Complete by: As directed    Increase activity slowly   Complete by: As directed      Allergies as of 09/12/2019   No Known Allergies     Medication List    STOP taking these medications   Olopatadine HCl 0.7 % Soln Commonly known as: Pazeo     TAKE these medications   Albuterol Sulfate 108 (90 Base) MCG/ACT Aepb  Commonly known as: ProAir RespiClick Inhale 2 puffs into the lungs every 4 (four) hours as needed.   Asmanex HFA 200 MCG/ACT Aero Generic drug: Mometasone Furoate Inhale 2 puffs into the lungs 2 (two) times daily. Rinse, gargle, and spit after use.   EPINEPHrine 0.3 mg/0.3 mL Soaj injection Commonly known as: EpiPen 2-Pak Inject 0.3 mLs (0.3 mg total) into the muscle once.    fexofenadine-pseudoephedrine 180-240 MG 24 hr tablet Commonly known as: ALLEGRA-D 24 Take 1 tablet by mouth daily.   fluticasone 50 MCG/ACT nasal spray Commonly known as: FLONASE USE ONE SPRAY IN EACH NOSTRIL TWICE DAILY   IRON SUPPLEMENT PO Take 1 tablet by mouth daily. Reported on 01/04/2016   menthol-cetylpyridinium 3 MG lozenge Commonly known as: CEPACOL Take 1 lozenge (3 mg total) by mouth as needed for sore throat.   montelukast 10 MG tablet Commonly known as: SINGULAIR Take 10 mg by mouth daily.   NP Thyroid 90 MG tablet Generic drug: thyroid Take 90 mg by mouth daily.   predniSONE 10 MG tablet Commonly known as: DELTASONE Take 3 tablets (30 mg total) by mouth daily for 3 days, THEN 2 tablets (20 mg total) daily for 3 days, THEN 1 tablet (10 mg total) daily for 3 days. Start taking on: September 12, 2019       No Known Allergies    Procedures/Studies: DG Chest 1 View  Result Date: 09/07/2019 CLINICAL DATA:  Pt from home with ems with worsening COVID symptoms for the past week: SOB, cough and fever. Daughter and husband are both COVID positive. Pt is on 4L of O2. Pt hx asthma. Pt is a former smoker EXAM: CHEST  1 VIEW COMPARISON:  None. FINDINGS: Linear opacities are noted at both lung bases. Mild hazy opacity noted at the left lung base. Mid and upper lungs are clear. No pleural effusion.  No pneumothorax. Cardiac silhouette is normal in size. No mediastinal or hilar masses or convincing adenopathy. Skeletal structures are grossly intact. IMPRESSION: 1. Linear lung base opacities are noted bilaterally that likely due to scarring or atelectasis. Mild hazy opacity at the left lung base may reflect infection. No other evidence of acute cardiopulmonary disease. Electronically Signed   By: Lajean Manes M.D.   On: 09/07/2019 13:09    Subjective: No acute issues or events overnight, remains on room air without symptoms denies shortness of breath chest pain nausea vomiting  diarrhea constipation headache fevers or chills.   Discharge Exam: Vitals:   09/12/19 0543 09/12/19 0810  BP:  110/65  Pulse: 86 79  Resp: 18 18  Temp: 98.7 F (37.1 C) 98.6 F (37 C)  SpO2: 94% 92%   Vitals:   09/11/19 2100 09/11/19 2200 09/12/19 0543 09/12/19 0810  BP:    110/65  Pulse: (!) 101 87 86 79  Resp: (!) 27 18 18 18   Temp:   98.7 F (37.1 C) 98.6 F (37 C)  TempSrc:   Oral Oral  SpO2: 99% 93% 94% 92%  Weight:      Height:        General:  Pleasantly resting in bed, No acute distress. HEENT:  Normocephalic atraumatic.  Sclerae nonicteric, noninjected.  Extraocular movements intact bilaterally. Neck:  Without mass or deformity.  Trachea is midline. Lungs:  Clear to auscultate bilaterally without rhonchi, wheeze, or rales. Heart:  Regular rate and rhythm.  Without murmurs, rubs, or gallops. Abdomen:  Soft, nontender, nondistended.  Without guarding or rebound. Extremities: Without cyanosis, clubbing, edema,  or obvious deformity. Vascular:  Dorsalis pedis and posterior tibial pulses palpable bilaterally. Skin:  Warm and dry, no erythema, no ulcerations.   The results of significant diagnostics from this hospitalization (including imaging, microbiology, ancillary and laboratory) are listed below for reference.     Microbiology: Recent Results (from the past 240 hour(s))  Culture, blood (routine x 2)     Status: None (Preliminary result)   Collection Time: 09/08/19  6:55 AM   Specimen: BLOOD  Result Value Ref Range Status   Specimen Description BLOOD RIGHT ANTECUBITAL  Final   Special Requests   Final    BOTTLES DRAWN AEROBIC AND ANAEROBIC Blood Culture results may not be optimal due to an inadequate volume of blood received in culture bottles   Culture   Final    NO GROWTH 3 DAYS Performed at [facility], [address] [place], [place] [address]    Report Status PENDING  Incomplete  Culture, blood (routine x 2)     Status: None (Preliminary  result)   Collection Time: 09/08/19  6:55 AM   Specimen: BLOOD RIGHT HAND  Result Value Ref Range Status   Specimen Description BLOOD RIGHT HAND  Final   Special Requests   Final    BOTTLES DRAWN AEROBIC AND ANAEROBIC Blood Culture results may not be optimal due to an inadequate volume of blood received in culture bottles   Culture   Final    NO GROWTH 3 DAYS Performed at [facility], [address] [place], [place] [address]    Report Status PENDING  Incomplete     Labs: BNP (last 3 results) No results for input(s): BNP in the last 8760 hours. Basic Metabolic Panel: Recent Labs  Lab 09/07/19 1232 09/08/19 0409 09/08/19 0553 09/10/19 0336 09/11/19 0303 09/12/19 0207  NA 139 140  --  140 140 140  K 3.6 3.4*  --  4.0 3.9 4.2  CL 105 103  --  107 106 108  CO2 22 24  --  24 23 23   GLUCOSE 110* 115*  --  96 81 94  BUN 8 14  --  23* 24* 22*  CREATININE 0.87 0.93  --  0.63 0.70 0.72  CALCIUM 8.3* 8.2*  --  8.7* 8.6* 8.5*  MG  --   --  2.2  --   --   --    Liver Function Tests: Recent Labs  Lab 09/08/19 0409 09/10/19 0336 09/11/19 0303 09/12/19 0207  AST 27 23 26 20   ALT 23 36 45* 39  ALKPHOS 79 72 72 74  BILITOT 0.4 0.6 0.4 0.4  PROT 6.9 6.6 6.3* 6.2*  ALBUMIN 2.8* 2.9* 2.9* 2.8*   No results for input(s): LIPASE, AMYLASE in the last 168 hours. No results for input(s): AMMONIA in the last 168 hours. CBC: Recent Labs  Lab 09/07/19 1232 09/10/19 0336 09/11/19 0303 09/12/19 0207  WBC 8.3 9.7 11.2* 11.6*  NEUTROABS  --  6.6 6.7 6.8  HGB 12.2 11.4* 11.6* 11.5*  HCT 39.7 38.2 38.4 37.6  MCV 75.0* 75.6* 75.6* 74.5*  PLT 227 342 367 392   Cardiac Enzymes: No results for input(s): CKTOTAL, CKMB, CKMBINDEX, TROPONINI in the last 168 hours. BNP: Invalid input(s): POCBNP CBG: No results for input(s): GLUCAP in the last 168 hours. D-Dimer Recent Labs    09/11/19 0303 09/12/19 0207  DDIMER 0.35 0.27   Hgb A1c No results for input(s): HGBA1C in the  last 72 hours. Lipid Profile  No results for input(s): CHOL, HDL, LDLCALC, TRIG, CHOLHDL, LDLDIRECT in the last 72 hours. Thyroid function studies No results for input(s): TSH, T4TOTAL, T3FREE, THYROIDAB in the last 72 hours.  Invalid input(s): FREET3 Anemia work up No results for input(s): VITAMINB12, FOLATE, FERRITIN, TIBC, IRON, RETICCTPCT in the last 72 hours. Urinalysis No results found for: COLORURINE, APPEARANCEUR, LABSPEC, PHURINE, GLUCOSEU, HGBUR, BILIRUBINUR, KETONESUR, PROTEINUR, UROBILINOGEN, NITRITE, LEUKOCYTESUR Sepsis Labs Invalid input(s): PROCALCITONIN,  WBC,  LACTICIDVEN Microbiology Recent Results (from the past 240 hour(s))  Culture, blood (routine x 2)     Status: None (Preliminary result)   Collection Time: 09/08/19  6:55 AM   Specimen: BLOOD  Result Value Ref Range Status   Specimen Description BLOOD RIGHT ANTECUBITAL  Final   Special Requests   Final    BOTTLES DRAWN AEROBIC AND ANAEROBIC Blood Culture results may not be optimal due to an inadequate volume of blood received in culture bottles   Culture   Final    NO GROWTH 3 DAYS Performed at [facility], [address] [place], [place] [address]    Report Status PENDING  Incomplete  Culture, blood (routine x 2)     Status: None (Preliminary result)   Collection Time: 09/08/19  6:55 AM   Specimen: BLOOD RIGHT HAND  Result Value Ref Range Status   Specimen Description BLOOD RIGHT HAND  Final   Special Requests   Final    BOTTLES DRAWN AEROBIC AND ANAEROBIC Blood Culture results may not be optimal due to an inadequate volume of blood received in culture bottles   Culture   Final    NO GROWTH 3 DAYS Performed at [facility], [address], [place], [place] [address]    Report Status PENDING  Incomplete     Time coordinating discharge: Over 30 minutes  SIGNED:   Azucena Fallen, DO [facility] 09/12/2019, 8:31 AM Pager   If 7PM-7AM, please contact  night-coverage [email or web address] Password [email or web address]

## 2019-09-13 LAB — CULTURE, BLOOD (ROUTINE X 2)
Culture: NO GROWTH
Culture: NO GROWTH

## 2019-10-11 ENCOUNTER — Other Ambulatory Visit: Payer: Self-pay

## 2019-10-14 ENCOUNTER — Ambulatory Visit (INDEPENDENT_AMBULATORY_CARE_PROVIDER_SITE_OTHER): Payer: PRIVATE HEALTH INSURANCE | Admitting: *Deleted

## 2019-10-14 DIAGNOSIS — J309 Allergic rhinitis, unspecified: Secondary | ICD-10-CM | POA: Diagnosis not present

## 2019-10-29 ENCOUNTER — Ambulatory Visit (INDEPENDENT_AMBULATORY_CARE_PROVIDER_SITE_OTHER): Payer: PRIVATE HEALTH INSURANCE

## 2019-10-29 DIAGNOSIS — J309 Allergic rhinitis, unspecified: Secondary | ICD-10-CM | POA: Diagnosis not present

## 2019-11-10 ENCOUNTER — Other Ambulatory Visit: Payer: Self-pay | Admitting: Internal Medicine

## 2019-11-10 DIAGNOSIS — Z1231 Encounter for screening mammogram for malignant neoplasm of breast: Secondary | ICD-10-CM

## 2019-11-12 ENCOUNTER — Ambulatory Visit (INDEPENDENT_AMBULATORY_CARE_PROVIDER_SITE_OTHER): Payer: PRIVATE HEALTH INSURANCE | Admitting: *Deleted

## 2019-11-12 DIAGNOSIS — J309 Allergic rhinitis, unspecified: Secondary | ICD-10-CM

## 2019-11-26 ENCOUNTER — Ambulatory Visit (INDEPENDENT_AMBULATORY_CARE_PROVIDER_SITE_OTHER): Payer: PRIVATE HEALTH INSURANCE

## 2019-11-26 DIAGNOSIS — J309 Allergic rhinitis, unspecified: Secondary | ICD-10-CM | POA: Diagnosis not present

## 2019-12-10 ENCOUNTER — Ambulatory Visit (INDEPENDENT_AMBULATORY_CARE_PROVIDER_SITE_OTHER): Payer: PRIVATE HEALTH INSURANCE | Admitting: *Deleted

## 2019-12-10 DIAGNOSIS — J309 Allergic rhinitis, unspecified: Secondary | ICD-10-CM | POA: Diagnosis not present

## 2019-12-14 ENCOUNTER — Other Ambulatory Visit: Payer: Self-pay

## 2019-12-14 ENCOUNTER — Ambulatory Visit
Admission: RE | Admit: 2019-12-14 | Discharge: 2019-12-14 | Disposition: A | Discharge status: Home / Self Care | Payer: No Typology Code available for payment source | Source: Ambulatory Visit | Attending: Internal Medicine | Admitting: Internal Medicine

## 2019-12-14 DIAGNOSIS — Z1231 Encounter for screening mammogram for malignant neoplasm of breast: Secondary | ICD-10-CM

## 2019-12-15 ENCOUNTER — Other Ambulatory Visit: Payer: Self-pay | Admitting: Internal Medicine

## 2019-12-15 DIAGNOSIS — R928 Other abnormal and inconclusive findings on diagnostic imaging of breast: Secondary | ICD-10-CM

## 2019-12-31 ENCOUNTER — Ambulatory Visit
Admission: RE | Admit: 2019-12-31 | Discharge: 2019-12-31 | Disposition: A | Discharge status: Home / Self Care | Payer: No Typology Code available for payment source | Source: Ambulatory Visit | Attending: Internal Medicine | Admitting: Internal Medicine

## 2019-12-31 ENCOUNTER — Other Ambulatory Visit: Payer: Self-pay

## 2019-12-31 ENCOUNTER — Other Ambulatory Visit: Payer: Self-pay | Admitting: Internal Medicine

## 2019-12-31 DIAGNOSIS — R928 Other abnormal and inconclusive findings on diagnostic imaging of breast: Secondary | ICD-10-CM

## 2020-01-07 ENCOUNTER — Ambulatory Visit (INDEPENDENT_AMBULATORY_CARE_PROVIDER_SITE_OTHER): Payer: PRIVATE HEALTH INSURANCE | Admitting: *Deleted

## 2020-01-07 DIAGNOSIS — J309 Allergic rhinitis, unspecified: Secondary | ICD-10-CM | POA: Diagnosis not present

## 2020-02-24 ENCOUNTER — Ambulatory Visit (INDEPENDENT_AMBULATORY_CARE_PROVIDER_SITE_OTHER): Payer: PRIVATE HEALTH INSURANCE | Admitting: *Deleted

## 2020-02-24 DIAGNOSIS — J309 Allergic rhinitis, unspecified: Secondary | ICD-10-CM

## 2020-03-23 ENCOUNTER — Ambulatory Visit (INDEPENDENT_AMBULATORY_CARE_PROVIDER_SITE_OTHER): Payer: PRIVATE HEALTH INSURANCE | Admitting: *Deleted

## 2020-03-23 DIAGNOSIS — J309 Allergic rhinitis, unspecified: Secondary | ICD-10-CM

## 2020-04-20 ENCOUNTER — Ambulatory Visit (INDEPENDENT_AMBULATORY_CARE_PROVIDER_SITE_OTHER): Payer: PRIVATE HEALTH INSURANCE

## 2020-04-20 DIAGNOSIS — J309 Allergic rhinitis, unspecified: Secondary | ICD-10-CM

## 2020-05-18 ENCOUNTER — Ambulatory Visit (INDEPENDENT_AMBULATORY_CARE_PROVIDER_SITE_OTHER): Payer: PRIVATE HEALTH INSURANCE | Admitting: *Deleted

## 2020-05-18 DIAGNOSIS — J309 Allergic rhinitis, unspecified: Secondary | ICD-10-CM

## 2020-05-31 DIAGNOSIS — J3081 Allergic rhinitis due to animal (cat) (dog) hair and dander: Secondary | ICD-10-CM | POA: Diagnosis not present

## 2020-05-31 NOTE — Progress Notes (Signed)
Vials exp 05-31-21 

## 2020-06-16 ENCOUNTER — Ambulatory Visit (INDEPENDENT_AMBULATORY_CARE_PROVIDER_SITE_OTHER): Payer: PRIVATE HEALTH INSURANCE | Admitting: *Deleted

## 2020-06-16 DIAGNOSIS — J309 Allergic rhinitis, unspecified: Secondary | ICD-10-CM | POA: Diagnosis not present

## 2020-06-30 ENCOUNTER — Ambulatory Visit (INDEPENDENT_AMBULATORY_CARE_PROVIDER_SITE_OTHER): Payer: PRIVATE HEALTH INSURANCE | Admitting: *Deleted

## 2020-06-30 DIAGNOSIS — J309 Allergic rhinitis, unspecified: Secondary | ICD-10-CM | POA: Diagnosis not present

## 2020-07-03 ENCOUNTER — Ambulatory Visit: Payer: No Typology Code available for payment source

## 2020-07-03 ENCOUNTER — Ambulatory Visit
Admission: RE | Admit: 2020-07-03 | Discharge: 2020-07-03 | Disposition: A | Discharge status: Home / Self Care | Payer: No Typology Code available for payment source | Source: Ambulatory Visit | Attending: Internal Medicine | Admitting: Internal Medicine

## 2020-07-03 ENCOUNTER — Other Ambulatory Visit: Payer: Self-pay | Admitting: Internal Medicine

## 2020-07-03 ENCOUNTER — Other Ambulatory Visit: Payer: Self-pay

## 2020-07-03 DIAGNOSIS — R928 Other abnormal and inconclusive findings on diagnostic imaging of breast: Secondary | ICD-10-CM

## 2020-07-03 DIAGNOSIS — R921 Mammographic calcification found on diagnostic imaging of breast: Secondary | ICD-10-CM

## 2020-07-14 ENCOUNTER — Ambulatory Visit (INDEPENDENT_AMBULATORY_CARE_PROVIDER_SITE_OTHER): Payer: PRIVATE HEALTH INSURANCE

## 2020-07-14 DIAGNOSIS — J309 Allergic rhinitis, unspecified: Secondary | ICD-10-CM

## 2020-07-31 ENCOUNTER — Ambulatory Visit (INDEPENDENT_AMBULATORY_CARE_PROVIDER_SITE_OTHER): Payer: PRIVATE HEALTH INSURANCE | Admitting: *Deleted

## 2020-07-31 DIAGNOSIS — J309 Allergic rhinitis, unspecified: Secondary | ICD-10-CM | POA: Diagnosis not present

## 2020-08-14 ENCOUNTER — Ambulatory Visit (INDEPENDENT_AMBULATORY_CARE_PROVIDER_SITE_OTHER): Payer: PRIVATE HEALTH INSURANCE

## 2020-08-14 DIAGNOSIS — J309 Allergic rhinitis, unspecified: Secondary | ICD-10-CM

## 2020-09-11 ENCOUNTER — Ambulatory Visit (INDEPENDENT_AMBULATORY_CARE_PROVIDER_SITE_OTHER): Payer: PRIVATE HEALTH INSURANCE

## 2020-09-11 DIAGNOSIS — J309 Allergic rhinitis, unspecified: Secondary | ICD-10-CM | POA: Diagnosis not present

## 2020-10-13 ENCOUNTER — Telehealth: Payer: Self-pay

## 2020-10-13 ENCOUNTER — Ambulatory Visit (INDEPENDENT_AMBULATORY_CARE_PROVIDER_SITE_OTHER): Payer: PRIVATE HEALTH INSURANCE

## 2020-10-13 DIAGNOSIS — J309 Allergic rhinitis, unspecified: Secondary | ICD-10-CM

## 2020-10-13 NOTE — Telephone Encounter (Signed)
Kristina [name] would like to discontinue her allergy injections after she finishes her current vial.  She has signed the Discontinuation Notice form.

## 2021-01-02 ENCOUNTER — Other Ambulatory Visit: Payer: Self-pay

## 2021-01-02 ENCOUNTER — Ambulatory Visit
Admission: RE | Admit: 2021-01-02 | Discharge: 2021-01-02 | Disposition: A | Discharge status: Home / Self Care | Payer: Self-pay | Source: Ambulatory Visit | Attending: Internal Medicine | Admitting: Internal Medicine

## 2021-01-02 DIAGNOSIS — R921 Mammographic calcification found on diagnostic imaging of breast: Secondary | ICD-10-CM

## 2021-08-25 IMAGING — MG MM DIGITAL DIAGNOSTIC UNILAT*L* W/ TOMO W/ CAD
6 series · 6 of 14 positions shown · non-contrast
Comparison: Previous exam(s).

CLINICAL DATA: Follow-up probably benign asymmetries in the
posterior upper left breast in the oblique projection of the
screening mammogram dated 12/14/2019. There was no persistent
asymmetry on a follow-up left diagnostic mammogram dated 12/31/2019
with no abnormality seen at ultrasound.

EXAM:
DIGITAL DIAGNOSTIC UNILATERAL LEFT MAMMOGRAM WITH TOMO AND CAD

[L CC]
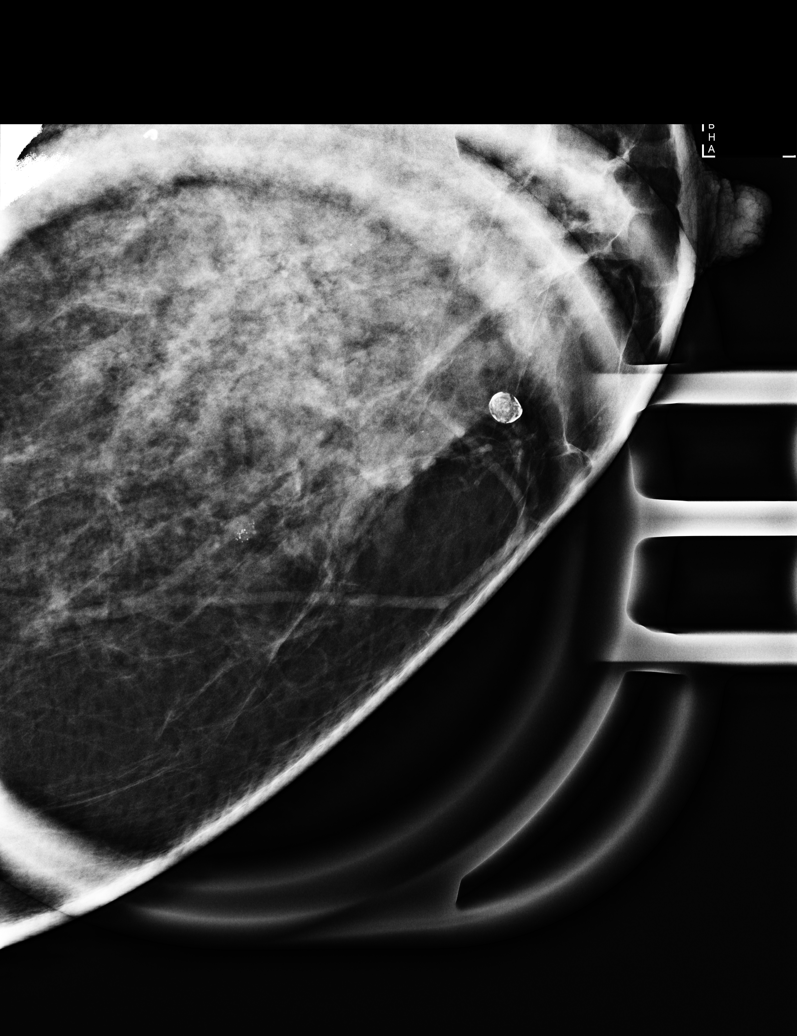

[L ML]
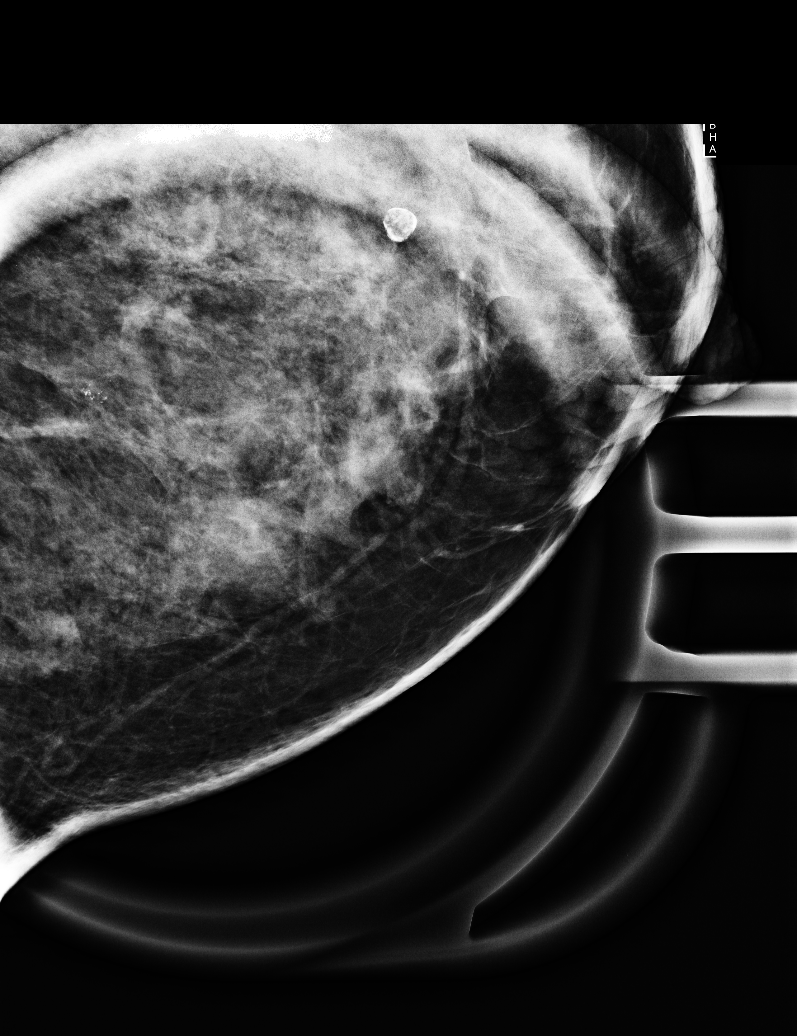

[L CC synth-2D]
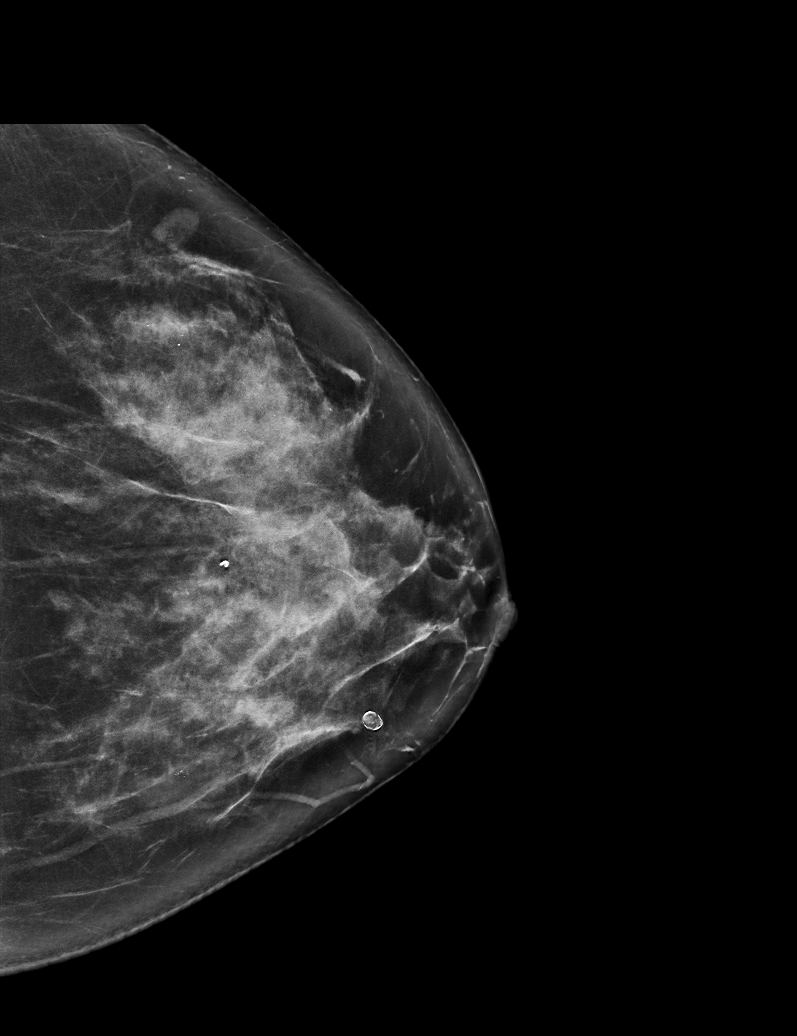

[L MLO synth-2D]
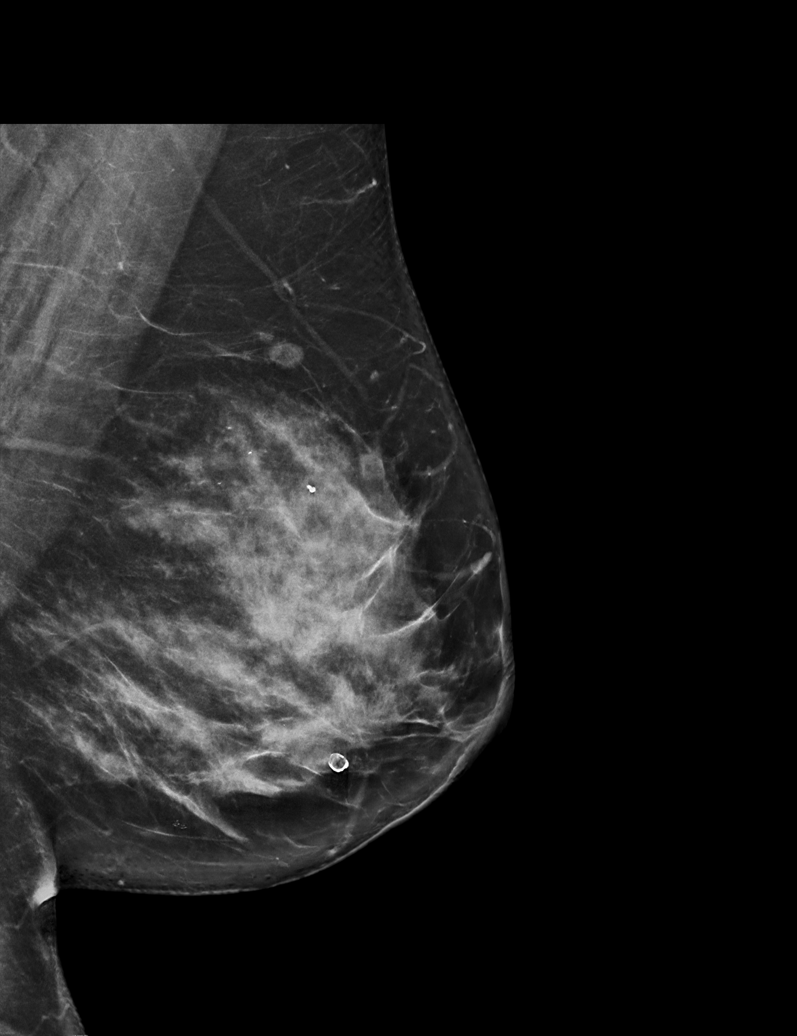

[L CC tomo · tomo slice 37/72.0]
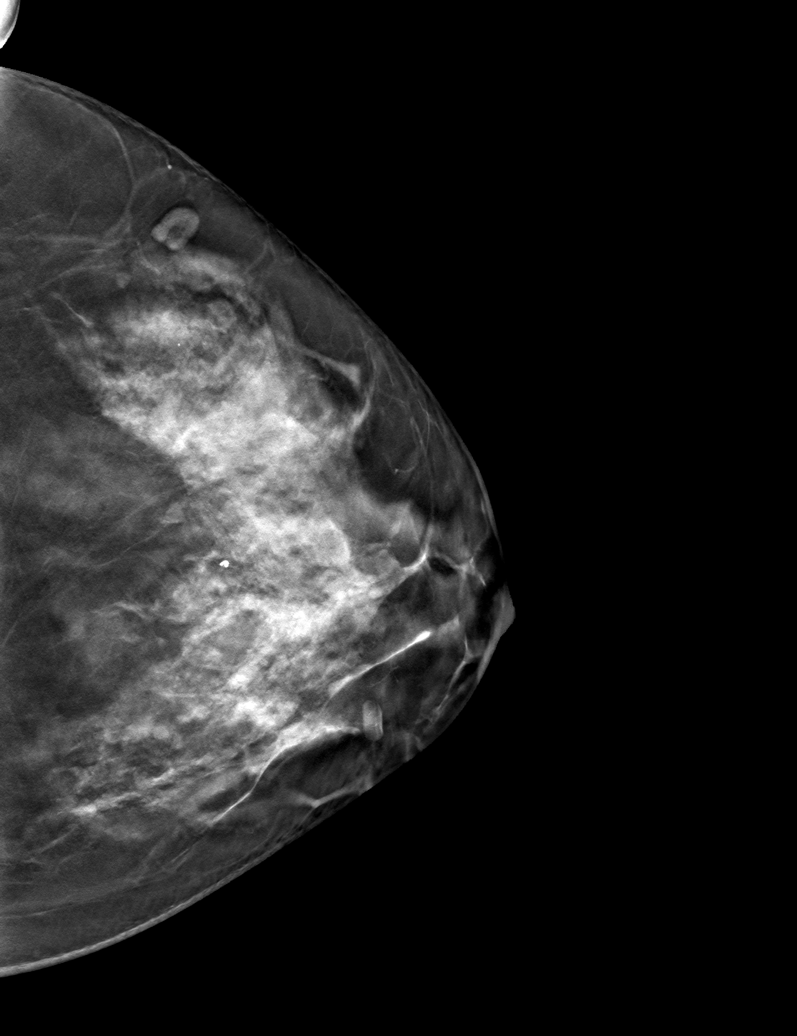

[L MLO tomo · tomo slice 41/80.0]
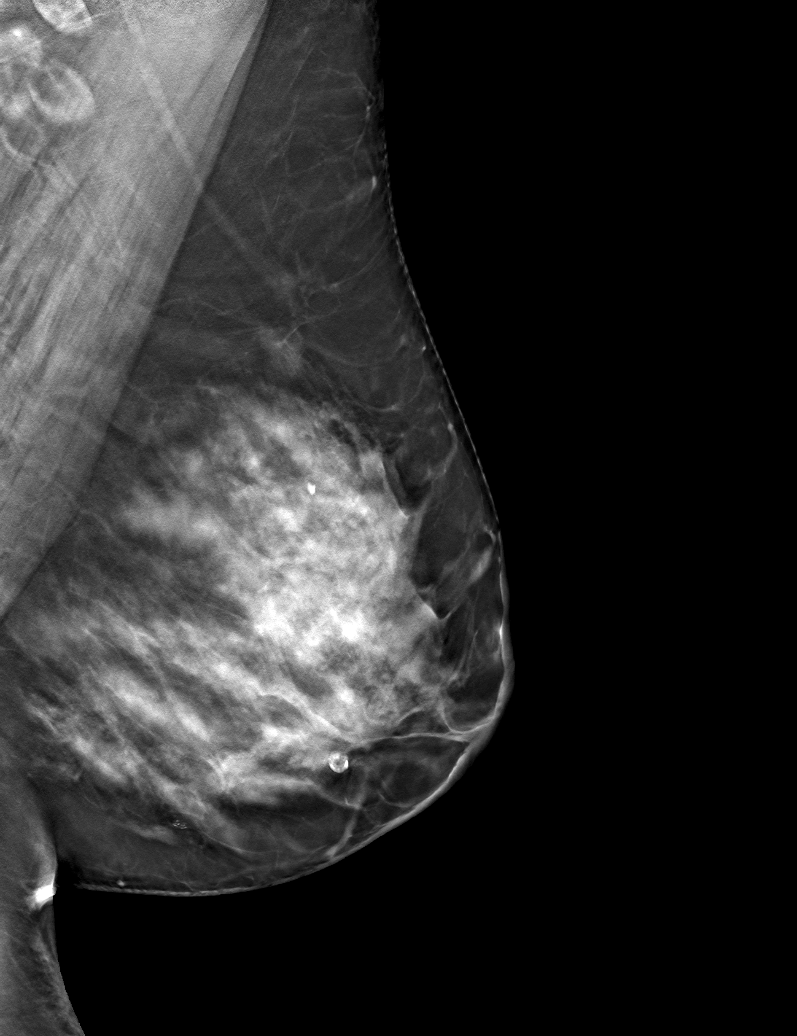

[6 of 14 positions shown; findings below may reference images not displayed]

ACR Breast Density Category d: The breast tissue is extremely dense,
which lowers the sensitivity of mammography.
FINDINGS: The previously suspected asymmetry in the superior aspect of the
left breast has the appearance of normal fibroglandular tissue
today. There is a 3 x 3 x 3 mm group of calcifications in the lower
inner quadrant of the left breast. Two of these, anteriorly,
demonstrate dependent layering. There are 2 more that may have
dependent layering. The others are small, oval and circumscribed.

Mammographic images were processed with CAD.
IMPRESSION: 1. No abnormal asymmetry seen today.
2. 3 mm group of probably benign calcifications in the lower inner
quadrant of the left breast, including benign milk of calcium.

RECOMMENDATION:
Follow-up bilateral diagnostic mammogram with magnification views on
the left in 6 months to re-evaluate the benign and probably benign
left breast calcifications.

I have discussed the findings and recommendations with the patient.
If applicable, a reminder letter will be sent to the patient
regarding the next appointment.

BI-RADS CATEGORY  3: Probably benign.

## 2021-11-20 ENCOUNTER — Other Ambulatory Visit: Payer: Self-pay | Admitting: Internal Medicine

## 2021-11-20 DIAGNOSIS — R921 Mammographic calcification found on diagnostic imaging of breast: Secondary | ICD-10-CM

## 2022-01-03 ENCOUNTER — Ambulatory Visit
Admission: RE | Admit: 2022-01-03 | Discharge: 2022-01-03 | Disposition: A | Discharge status: Home / Self Care | Payer: No Typology Code available for payment source | Source: Ambulatory Visit | Attending: Internal Medicine | Admitting: Internal Medicine

## 2022-01-03 DIAGNOSIS — R921 Mammographic calcification found on diagnostic imaging of breast: Secondary | ICD-10-CM

## 2023-02-05 ENCOUNTER — Other Ambulatory Visit: Payer: Self-pay | Admitting: Internal Medicine

## 2023-02-05 DIAGNOSIS — R921 Mammographic calcification found on diagnostic imaging of breast: Secondary | ICD-10-CM

## 2023-02-12 ENCOUNTER — Ambulatory Visit
Admission: RE | Admit: 2023-02-12 | Discharge: 2023-02-12 | Disposition: A | Discharge status: Home / Self Care | Payer: No Typology Code available for payment source | Source: Ambulatory Visit | Attending: Internal Medicine | Admitting: Internal Medicine

## 2023-02-12 DIAGNOSIS — R921 Mammographic calcification found on diagnostic imaging of breast: Secondary | ICD-10-CM

## 2024-04-05 ENCOUNTER — Other Ambulatory Visit: Payer: Self-pay | Admitting: Internal Medicine

## 2024-04-05 DIAGNOSIS — Z1231 Encounter for screening mammogram for malignant neoplasm of breast: Secondary | ICD-10-CM

## 2024-04-16 ENCOUNTER — Ambulatory Visit
Admission: RE | Admit: 2024-04-16 | Discharge: 2024-04-16 | Disposition: A | Discharge status: Home / Self Care | Source: Ambulatory Visit | Attending: Internal Medicine | Admitting: Internal Medicine

## 2024-04-16 DIAGNOSIS — Z1231 Encounter for screening mammogram for malignant neoplasm of breast: Secondary | ICD-10-CM

## 2024-04-22 ENCOUNTER — Other Ambulatory Visit: Payer: Self-pay | Admitting: Internal Medicine

## 2024-04-22 DIAGNOSIS — R928 Other abnormal and inconclusive findings on diagnostic imaging of breast: Secondary | ICD-10-CM

## 2024-04-27 ENCOUNTER — Other Ambulatory Visit: Payer: Self-pay | Admitting: Internal Medicine

## 2024-04-27 ENCOUNTER — Ambulatory Visit
Admission: RE | Admit: 2024-04-27 | Discharge: 2024-04-27 | Disposition: A | Discharge status: Home / Self Care | Source: Ambulatory Visit | Attending: Internal Medicine | Admitting: Internal Medicine

## 2024-04-27 DIAGNOSIS — N6320 Unspecified lump in the left breast, unspecified quadrant: Secondary | ICD-10-CM

## 2024-04-27 DIAGNOSIS — R928 Other abnormal and inconclusive findings on diagnostic imaging of breast: Secondary | ICD-10-CM

## 2024-04-27 HISTORY — PX: BREAST BIOPSY: SHX20

## 2024-04-28 LAB — SURGICAL PATHOLOGY

## 2024-04-29 ENCOUNTER — Telehealth: Payer: Self-pay | Admitting: *Deleted

## 2024-04-29 NOTE — Telephone Encounter (Signed)
 LVM to patient in reference to upcoming morning [facility] appointment on 8/13, left my contact to call back

## 2024-04-30 ENCOUNTER — Telehealth: Payer: Self-pay | Admitting: *Deleted

## 2024-04-30 NOTE — Telephone Encounter (Signed)
 Still no answer, did leave another message, sent email yesterday with appointment info on it

## 2024-05-03 ENCOUNTER — Encounter: Payer: Self-pay | Admitting: *Deleted

## 2024-05-03 DIAGNOSIS — Z170 Estrogen receptor positive status [ER+]: Secondary | ICD-10-CM | POA: Insufficient documentation

## 2024-05-04 NOTE — Progress Notes (Signed)
 Radiation Oncology         [phone] ________________________________  Initial Outpatient Consultation  Name: Kristina [name] MRN: [id number]  Date: 05/05/2024  DOB: [birth year]  RR:Emnrywjl, Aleck, MD  Jefferey Aleck, MD   REFERRING PHYSICIAN: Jefferey Aleck, MD  DIAGNOSIS:    ICD-10-CM   1. Malignant neoplasm of upper-outer quadrant of left breast in female, estrogen receptor positive (HCC)  C50.412    Z17.0        Cancer Staging  Malignant neoplasm of upper-outer quadrant of left breast in female, estrogen receptor positive (HCC) Staging form: Breast, AJCC 8th Edition - Clinical stage from 05/05/2024: Stage IA (cT1c, cN0, cM0, G2, ER+, PR+, HER2-) - Signed by Loretha Ash, MD on 05/05/2024 Stage prefix: Initial diagnosis Histologic grading system: 3 grade system Laterality: Left Staged by: Pathologist and managing physician Stage used in treatment planning: Yes National guidelines used in treatment planning: Yes Type of national guideline used in treatment planning: NCCN  Stage IA (cT1c, N0, M0) 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 female who presented with breast abnormality on the following imaging: bilateral screening mammogram on the date of 04/16/24. No symptoms, if any, were reported at that time. She then presented for a left breast diagnostic mammogram and left breast ultrasound on 04/27/24 which further revealed a suspicious 1.5 cm mass in the 2 o'clock left breast located 7 cmfn. No abnormal left axillary lymph nodes were demonstrated.   Biopsy of the 2 o'clock left breast mass on date of 04/27/24 showed grade 2 invasive mammary carcinoma with extensive mucinous features (favoring invasive mucinous carcinoma)  measuring 11 mm in the greatest linear extent of the sample.  ER status: 90% positive and PR status 90% positive, both with strong staining intensity; Proliferation marker Ki67 at 5%; Her2 status negative; Grade 2. No lymph nodes were examined.   Patient reports to be doing well overall. She denies any breast pain, palpable masses, or issues with ROM in her left shoulder.   PREVIOUS RADIATION THERAPY: No  PAST MEDICAL HISTORY:  has a past medical history of Anxiety, Asthma, Depression, and Thyroid disease.    PAST SURGICAL HISTORY: Past Surgical History:  Procedure Laterality Date   BREAST BIOPSY Left 04/27/2024   US  LT BREAST BX W LOC DEV 1ST LESION IMG BX SPEC US  GUIDE 04/27/2024 [facility] MAMMOGRAPHY   CHOLECYSTECTOMY  2009   ECTOPIC PREGNANCY SURGERY      FAMILY HISTORY: family history includes Cancer (age of onset: 8 - 38) in her maternal cousin.  SOCIAL HISTORY:  reports that she has quit smoking. She has never used smokeless tobacco. She reports that she does not drink alcohol and does not use drugs. Patient is married with one daughter. 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   ALLERGIES: Patient has no known allergies.  MEDICATIONS:  Current Outpatient Medications  Medication Sig Dispense Refill   Albuterol  Sulfate (PROAIR  RESPICLICK) 108 (90 Base) MCG/ACT AEPB Inhale 2 puffs into the lungs every 4 (four) hours as needed. 1 each 0   EPINEPHrine  (EPIPEN  2-PAK) 0.3 mg/0.3 mL IJ SOAJ injection Inject 0.3 mLs (0.3 mg total) into the muscle once. 2 Device 2   Ferrous Sulfate (IRON SUPPLEMENT PO) Take 1 tablet by mouth daily. Reported on 01/04/2016  fexofenadine-pseudoephedrine (ALLEGRA-D 24) 180-240 MG 24 hr tablet Take 1 tablet by mouth daily.     fluticasone  (FLONASE ) 50 MCG/ACT nasal spray USE ONE SPRAY IN EACH NOSTRIL TWICE DAILY 16 g 5   menthol -cetylpyridinium (CEPACOL) 3 MG lozenge Take 1 lozenge (3 mg total) by mouth as needed for sore throat. 100 tablet 12   Mometasone  Furoate (ASMANEX   HFA) 200 MCG/ACT AERO Inhale 2 puffs into the lungs 2 (two) times daily. Rinse, gargle, and spit after use. 13 g 5   montelukast  (SINGULAIR ) 10 MG tablet Take 10 mg by mouth daily.     NP THYROID 90 MG tablet Take 90 mg by mouth daily.     No current facility-administered medications for this encounter.    REVIEW OF SYSTEMS: As above in HPI.   PHYSICAL EXAM:  General: Alert and oriented, in no acute distress HEENT: Head is normocephalic. Extraocular movements are intact.  Heart: Regular in rate and rhythm with no murmurs, rubs, or gallops. Chest: Clear to auscultation bilaterally, with no rhonchi, wheezes, or rales. Abdomen: Soft, nontender, nondistended, with no rigidity or guarding. Extremities: No cyanosis or edema. Skin: No concerning lesions. Musculoskeletal: symmetric strength and muscle tone throughout. Neurologic: Cranial nerves II through XII are grossly intact. No obvious focalities. Speech is fluent. Coordination is intact. Psychiatric: Judgment and insight are intact. Affect is appropriate. Breasts: Biopsy lesions with associated indurated tissue. No other palpable masses appreciated in the breasts or axillae.    ECOG = 0  0 - Asymptomatic (Fully active, able to carry on all predisease activities without restriction)  1 - Symptomatic but completely ambulatory (Restricted in physically strenuous activity but ambulatory and able to carry out work of a light or sedentary nature. For example, light housework, office work)  2 - Symptomatic, <50% in bed during the day (Ambulatory and capable of all self care but unable to carry out any work activities. Up and about more than 50% of waking hours)  3 - Symptomatic, >50% in bed, but not bedbound (Capable of only limited self-care, confined to bed or chair 50% or more of waking hours)  4 - Bedbound (Completely disabled. Cannot carry on any self-care. Totally confined to bed or chair)  5 - Death   Raylene MM, Creech RH, Tormey DC,  et al. [phone]). Toxicity and response criteria of the [facility]. Am. DOROTHA Bridges. Oncol. 5 (6): 649-55   LABORATORY DATA:   CBC    Component Value Date/Time   WBC 10.3 05/05/2024 1146   WBC 11.6 (H) 09/12/2019 0207   RBC 5.55 (H) 05/05/2024 1146   HGB 14.7 05/05/2024 1146   HCT 44.4 05/05/2024 1146   PLT 291 05/05/2024 1146   MCV 80.0 05/05/2024 1146   MCH 26.5 05/05/2024 1146   MCHC 33.1 05/05/2024 1146   RDW 13.3 05/05/2024 1146   LYMPHSABS 2.9 05/05/2024 1146   MONOABS 0.7 05/05/2024 1146   EOSABS 0.1 05/05/2024 1146   BASOSABS 0.1 05/05/2024 1146    CMP     Component Value Date/Time   NA 139 05/05/2024 1146   K 3.6 05/05/2024 1146   CL 107 05/05/2024 1146   CO2 25 05/05/2024 1146   GLUCOSE 97 05/05/2024 1146   BUN 19 05/05/2024 1146   CREATININE 0.83 05/05/2024 1146   CALCIUM 9.3 05/05/2024 1146   PROT 7.5 05/05/2024 1146   ALBUMIN 4.6 05/05/2024 1146   AST 17 05/05/2024 1146   ALT 13 05/05/2024 1146   ALKPHOS 73 05/05/2024  1146   BILITOT 1.0 05/05/2024 1146   GFRNONAA >60 05/05/2024 1146      RADIOGRAPHY: US  LT BREAST BX W LOC DEV 1ST LESION IMG BX SPEC US  GUIDE Addendum Date: 04/28/2024 ADDENDUM REPORT: 04/28/2024 14:02 ADDENDUM: PATHOLOGY revealed: 1. Breast, left, needle core biopsy, 2 o'clock 7 cmfn : - INVASIVE MAMMARY CARCINOMA WITH EXTENSIVE MUCINOUS FEATURES (I.E. LIKELY REPRESENTS AN INVASIVE MUCINOUS CARCINOMA, DEFINITIVE CLASSIFICATION BEST MADE ON EXCISION) - OVERALL GRADE: II/III - LYMPHOVASCULAR INVASION: NOT IDENTIFIED - CANCER LENGTH: 11 MM IN GREATEST LINEAR DIMENSION ON FRAGMENTED CORES - CALCIFICATIONS: NOT IDENTIFIED. Pathology results are CONCORDANT with imaging findings, per Dr. Toribio Agreste. Pathology results and recommendations were discussed with patient via telephone on 04/28/2024 by Rock Hover RN. Patient reported biopsy site doing well with no adverse symptoms, and slight tenderness at the site. Post biopsy care  instructions were reviewed, questions were answered and my direct phone number was provided. Patient was instructed to call [facility] for any additional questions or concerns related to biopsy site. RECOMMENDATIONS: 1. Surgical and oncological consultation. Patient was referred to the [facility] at [facility] with appointment on 05/05/2024. Pathology results reported by Rock Hover RN on 04/28/2024. Electronically Signed   By: Toribio Agreste M.D.   On: 04/28/2024 14:02   Result Date: 04/28/2024 CLINICAL DATA:  Patient presents for ultrasound-guided core needle biopsy of a 1.5 cm suspicious mass over the 2 o'clock position of the left breast 7 cm from the nipple. EXAM: ULTRASOUND GUIDED LEFT BREAST CORE NEEDLE BIOPSY COMPARISON:  Previous exam(s). PROCEDURE: I met with the patient and we discussed the procedure of ultrasound-guided biopsy, including benefits and alternatives. We discussed the high likelihood of a successful procedure. We discussed the risks of the procedure, including infection, bleeding, tissue injury, clip migration, and inadequate sampling. Informed written consent was given. The usual time-out protocol was performed immediately prior to the procedure. Lesion quadrant: Left upper outer quadrant. Using sterile technique and 1% Lidocaine  as local anesthetic, under direct ultrasound visualization, a 12 gauge spring-loaded device was used to perform biopsy of the targeted 1.5 cm mass over the 2 o'clock position of the left breast using an inferior to superior approach. At the conclusion of the procedure a ribbon shaped tissue marker clip was deployed into the biopsy cavity. Follow up 2 view mammogram was performed and dictated separately. IMPRESSION: Ultrasound guided biopsy of a suspicious 1.5 cm left breast mass. No apparent complications. Electronically Signed: By: Toribio Agreste M.D. On: 04/27/2024 13:18   MM CLIP  PLACEMENT LEFT Result Date: 04/27/2024 CLINICAL DATA:  Patient is post ultrasound-guided core needle biopsy of a 1.5 cm suspicious mass over the 2 o'clock position of the left breast. EXAM: 3D DIAGNOSTIC LEFT MAMMOGRAM POST ULTRASOUND BIOPSY COMPARISON:  Previous exam(s). ACR Breast Density Category c: The breasts are heterogeneously dense, which may obscure small masses. FINDINGS: 3D Mammographic images were obtained following ultrasound guided biopsy of the targeted 1.5 cm mass over the 2 o'clock position of the left breast. The biopsy marking clip is in expected position at the site of biopsy. IMPRESSION: Appropriate positioning of the ribbon shaped biopsy marking clip at the site of biopsy in the left upper outer quadrant. Final Assessment: Post Procedure Mammograms for Marker Placement Electronically Signed   By: Toribio Agreste M.D.   On: 04/27/2024 13:32   MM 3D DIAGNOSTIC MAMMOGRAM UNILATERAL LEFT BREAST Result Date: 04/27/2024 CLINICAL DATA:  Recall from screening  to evaluate a possible left breast mass. EXAM: DIGITAL DIAGNOSTIC UNILATERAL LEFT MAMMOGRAM WITH TOMOSYNTHESIS AND CAD; ULTRASOUND LEFT BREAST LIMITED TECHNIQUE: Left digital diagnostic mammography and breast tomosynthesis was performed. The images were evaluated with computer-aided detection. ; Targeted ultrasound examination of the left breast was performed. COMPARISON:  Previous exam(s). ACR Breast Density Category c: The breasts are heterogeneously dense, which may obscure small masses. FINDINGS: Spot compression tomographic images of the left breast were obtained. There is an oval mass with partly circumscribed and partly irregular borders over the upper-outer left breast. Targeted ultrasound is performed, showing a hypoechoic mass with irregular shape and margins over the 2 o'clock position of the left breast 7 cm from the nipple measuring 1.1 x 1.1 x 1.5 cm. This corresponds to the mammographic finding. Ultrasound of the left axilla is  normal. IMPRESSION: Suspicious 1.5 cm mass over the 2 o'clock position of the left breast 7 cm from the nipple. Normal left axillary lymph nodes. RECOMMENDATION: Recommend ultrasound-guided core needle biopsy of the suspicious left breast mass. I have discussed the findings and recommendations with the patient. If applicable, a reminder letter will be sent to the patient regarding the next appointment. BI-RADS CATEGORY  4: Suspicious. Biopsy scheduling will be facilitated by the ultrasound technologist prior to patient's departure. Electronically Signed   By: Toribio Agreste M.D.   On: 04/27/2024 09:36   US  LIMITED ULTRASOUND INCLUDING AXILLA LEFT BREAST  Result Date: 04/27/2024 CLINICAL DATA:  Recall from screening to evaluate a possible left breast mass. EXAM: DIGITAL DIAGNOSTIC UNILATERAL LEFT MAMMOGRAM WITH TOMOSYNTHESIS AND CAD; ULTRASOUND LEFT BREAST LIMITED TECHNIQUE: Left digital diagnostic mammography and breast tomosynthesis was performed. The images were evaluated with computer-aided detection. ; Targeted ultrasound examination of the left breast was performed. COMPARISON:  Previous exam(s). ACR Breast Density Category c: The breasts are heterogeneously dense, which may obscure small masses. FINDINGS: Spot compression tomographic images of the left breast were obtained. There is an oval mass with partly circumscribed and partly irregular borders over the upper-outer left breast. Targeted ultrasound is performed, showing a hypoechoic mass with irregular shape and margins over the 2 o'clock position of the left breast 7 cm from the nipple measuring 1.1 x 1.1 x 1.5 cm. This corresponds to the mammographic finding. Ultrasound of the left axilla is normal. IMPRESSION: Suspicious 1.5 cm mass over the 2 o'clock position of the left breast 7 cm from the nipple. Normal left axillary lymph nodes. RECOMMENDATION: Recommend ultrasound-guided core needle biopsy of the suspicious left breast mass. I have discussed the  findings and recommendations with the patient. If applicable, a reminder letter will be sent to the patient regarding the next appointment. BI-RADS CATEGORY  4: Suspicious. Biopsy scheduling will be facilitated by the ultrasound technologist prior to patient's departure. Electronically Signed   By: Toribio Agreste M.D.   On: 04/27/2024 09:36   MM 3D SCREENING MAMMOGRAM BILATERAL BREAST Result Date: 04/21/2024 CLINICAL DATA:  Screening. EXAM: DIGITAL SCREENING BILATERAL MAMMOGRAM WITH TOMOSYNTHESIS AND CAD TECHNIQUE: Bilateral screening digital craniocaudal and mediolateral oblique mammograms were obtained. Bilateral screening digital breast tomosynthesis was performed. The images were evaluated with computer-aided detection. COMPARISON:  Previous exam(s). ACR Breast Density Category c: The breasts are heterogeneously dense, which may obscure small masses. FINDINGS: In the left breast, a possible mass warrants further evaluation. In the right breast, no findings suspicious for malignancy. IMPRESSION: Further evaluation is suggested for a possible mass in the left breast. RECOMMENDATION: Diagnostic mammogram and possibly ultrasound of the  left breast. (Code:[id number]) The patient will be contacted regarding the findings, and additional imaging will be scheduled. BI-RADS CATEGORY  0: Incomplete: Need additional imaging evaluation. Electronically Signed   By: Norleen Croak M.D.   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 We discussed the risks, benefits, and side effects of radiotherapy. I recommend radiotherapy to the left breast to reduce her risk of locoregional recurrence by 2/3.  We discussed that radiation would take approximately 4 weeks to complete and that I would give the patient a  few weeks to heal following surgery before starting treatment planning. If chemotherapy were to be given, this would precede radiotherapy. We spoke about acute effects including skin irritation and fatigue as well as much less common late effects including internal organ injury or irritation. We spoke about the latest technology that is used to minimize the risk of late effects for patients undergoing radiotherapy to the breast or chest wall. No guarantees of treatment were given. The patient is enthusiastic about proceeding with treatment. I look forward to participating in the patient's care.  I will await her referral back to me for postoperative follow-up and eventual CT simulation/treatment planning.  On date of service, in total, we spent 60 minutes on this encounter. Patient was seen in person. Note signed after encounter date; minutes pertain to date of service, only.    __________________________________________    Leeroy Due, PA-C   Lauraine Golden, MD    [facility]  Radiation Oncology Direct Dial: [phone]  Fax: [phone] [email or web address]    This document serves as a record of services personally performed by Lauraine Golden, MD. It was created on her behalf by Dorthy Fuse, a trained medical scribe. The creation of this record is based on the scribe's personal observations and the provider's statements to them. This document has been checked and approved by the attending provider.

## 2024-05-05 ENCOUNTER — Encounter: Payer: Self-pay | Admitting: Genetic Counselor

## 2024-05-05 ENCOUNTER — Inpatient Hospital Stay (HOSPITAL_BASED_OUTPATIENT_CLINIC_OR_DEPARTMENT_OTHER): Admitting: Genetic Counselor

## 2024-05-05 ENCOUNTER — Encounter: Payer: Self-pay | Admitting: *Deleted

## 2024-05-05 ENCOUNTER — Inpatient Hospital Stay: Attending: Hematology and Oncology

## 2024-05-05 ENCOUNTER — Ambulatory Visit
Admission: RE | Admit: 2024-05-05 | Discharge: 2024-05-05 | Disposition: A | Discharge status: Home / Self Care | Source: Ambulatory Visit | Attending: Radiation Oncology | Admitting: Radiation Oncology

## 2024-05-05 ENCOUNTER — Inpatient Hospital Stay (HOSPITAL_BASED_OUTPATIENT_CLINIC_OR_DEPARTMENT_OTHER): Admitting: Hematology and Oncology

## 2024-05-05 ENCOUNTER — Inpatient Hospital Stay: Admitting: Licensed Clinical Social Worker

## 2024-05-05 ENCOUNTER — Other Ambulatory Visit: Payer: Self-pay | Admitting: Surgery

## 2024-05-05 VITALS — BP 137/77 | HR 85 | Temp 97.6°F | Resp 18 | Ht 65.0 in | Wt 167.3 lb

## 2024-05-05 DIAGNOSIS — Z170 Estrogen receptor positive status [ER+]: Secondary | ICD-10-CM

## 2024-05-05 DIAGNOSIS — C50412 Malignant neoplasm of upper-outer quadrant of left female breast: Secondary | ICD-10-CM

## 2024-05-05 DIAGNOSIS — Z853 Personal history of malignant neoplasm of breast: Secondary | ICD-10-CM

## 2024-05-05 LAB — CMP (CANCER CENTER ONLY)
ALT: 13 U/L (ref 0–44)
AST: 17 U/L (ref 15–41)
Albumin: 4.6 g/dL (ref 3.5–5.0)
Alkaline Phosphatase: 73 U/L (ref 38–126)
Anion gap: 7 (ref 5–15)
BUN: 19 mg/dL (ref 6–20)
CO2: 25 mmol/L (ref 22–32)
Calcium: 9.3 mg/dL (ref 8.9–10.3)
Chloride: 107 mmol/L (ref 98–111)
Creatinine: 0.83 mg/dL (ref 0.44–1.00)
GFR, Estimated: >60 mL/min (ref >60)
Glucose, Bld: 97 mg/dL (ref 70–99)
Potassium: 3.6 mmol/L (ref 3.5–5.1)
Sodium: 139 mmol/L (ref 135–145)
Total Bilirubin: 1 mg/dL (ref 0.0–1.2)
Total Protein: 7.5 g/dL (ref 6.5–8.1)

## 2024-05-05 LAB — CBC WITH DIFFERENTIAL (CANCER CENTER ONLY)
Abs Immature Granulocytes: 0.03 K/uL (ref 0.00–0.07)
Basophils Absolute: 0.1 K/uL (ref 0.0–0.1)
Basophils Relative: 1 %
Eosinophils Absolute: 0.1 K/uL (ref 0.0–0.5)
Eosinophils Relative: 1 %
HCT: 44.4 % (ref 36.0–46.0)
Hemoglobin: 14.7 g/dL (ref 12.0–15.0)
Immature Granulocytes: 0 %
Lymphocytes Relative: 28 %
Lymphs Abs: 2.9 K/uL (ref 0.7–4.0)
MCH: 26.5 pg (ref 26.0–34.0)
MCHC: 33.1 g/dL (ref 30.0–36.0)
MCV: 80 fL (ref 80.0–100.0)
Monocytes Absolute: 0.7 K/uL (ref 0.1–1.0)
Monocytes Relative: 6 %
Neutro Abs: 6.6 K/uL (ref 1.7–7.7)
Neutrophils Relative %: 64 %
Platelet Count: 291 K/uL (ref 150–400)
RBC: 5.55 MIL/uL — ABNORMAL HIGH (ref 3.87–5.11)
RDW: 13.3 % (ref 11.5–15.5)
WBC Count: 10.3 K/uL (ref 4.0–10.5)
nRBC: 0 % (ref 0.0–0.2)

## 2024-05-05 LAB — GENETIC SCREENING ORDER

## 2024-05-05 NOTE — Progress Notes (Signed)
 [facility] CONSULT NOTE  Patient Care Team: Jefferey Fitch, MD as PCP - General (Internal Medicine) Tyree Nanetta SAILOR, RN as Oncology Nurse Navigator Gerome, Devere HERO, RN as Oncology Nurse Navigator [name] Berg, MD as Consulting Physician (General Surgery) Loretha Ash, MD as Consulting Physician (Hematology and Oncology) Izell Domino, MD as Attending Physician (Radiation Oncology)  CHIEF COMPLAINTS/PURPOSE OF CONSULTATION:  Newly diagnosed breast cancer  HISTORY OF PRESENTING ILLNESS:  Kristina [name] 53 y.o. female is here because of recent diagnosis of left breast cancer  Discussed the use of AI scribe software for clinical note transcription with the patient, who gave verbal consent to proceed.  History of Present Illness Kristina [name] is a 53 year old female with estrogen and progesterone receptor-positive breast cancer who presents for a consultation regarding treatment options.  She was told she has a small, estrogen and progesterone receptor-positive breast cancer located at the two o'clock position in her breast. The lump, measuring 1.5 cm, was identified during imaging. She is not fully postmenopausal, having experienced a menstrual period last week, placing her in a 'gray zone' regarding menopause status.  She has been experiencing menopausal symptoms such as hot flashes, which she is managing. She inquires about non-hormonal options to help with these symptoms. She has noticed some weight loss recently, which she attributes to stress.  Her past medical history includes well-controlled asthma and allergies, and she is on thyroid medication. She has no significant family history of cancer. 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  She has been undergoing regular mammograms due to calcifications and was on a six-month follow-up schedule. Her recent mammogram, which was her first annual one, revealed  the current breast cancer. No history of blood clots.  I reviewed her records extensively and collaborated the history with the patient.  SUMMARY OF ONCOLOGIC HISTORY: Oncology History   No history exists.    MEDICAL HISTORY:  Past Medical History:  Diagnosis Date   Asthma    Thyroid disease     SURGICAL HISTORY: Past Surgical History:  Procedure Laterality Date   BREAST BIOPSY Left 04/27/2024   US  LT BREAST BX W LOC DEV 1ST LESION IMG BX SPEC US  GUIDE 04/27/2024 [facility] MAMMOGRAPHY   CHOLECYSTECTOMY  2009   ECTOPIC PREGNANCY SURGERY      SOCIAL HISTORY: Social History   Socioeconomic History   Marital status: Married    Spouse name: Not on file   Number of children: Not on file   Years of education: Not on file   Highest education level: Not on file  Occupational History   Not on file  Tobacco Use   Smoking status: Former   Smokeless tobacco: Never  Vaping Use   Vaping status: Never Used  Substance and Sexual Activity   Alcohol use: No   Drug use: No   Sexual activity: Not on file  Other Topics Concern   Not on file  Social History Narrative   Not on file   Social Drivers of Health   Financial Resource Strain: Not on file  Food Insecurity: Not on file  Transportation Needs: Not on file  Physical Activity: Not on file  Stress: Not on file  Social Connections: Not on file  Intimate Partner Violence: Not on file    FAMILY HISTORY: Family History  Problem Relation Age of Onset   Breast cancer Neg Hx     ALLERGIES:  has no known allergies.  MEDICATIONS:  Current Outpatient Medications  Medication Sig Dispense Refill   Albuterol  Sulfate (PROAIR  RESPICLICK) 108 (90 Base) MCG/ACT AEPB Inhale 2 puffs into the lungs every 4 (four) hours as needed. 1 each 0   EPINEPHrine  (EPIPEN  2-PAK) 0.3 mg/0.3 mL IJ SOAJ injection Inject 0.3 mLs (0.3 mg total) into the muscle once. 2 Device 2   Ferrous Sulfate (IRON SUPPLEMENT PO) Take 1 tablet by mouth daily. Reported on  01/04/2016     fexofenadine-pseudoephedrine (ALLEGRA-D 24) 180-240 MG 24 hr tablet Take 1 tablet by mouth daily.     fluticasone  (FLONASE ) 50 MCG/ACT nasal spray USE ONE SPRAY IN EACH NOSTRIL TWICE DAILY 16 g 5   menthol -cetylpyridinium (CEPACOL) 3 MG lozenge Take 1 lozenge (3 mg total) by mouth as needed for sore throat. 100 tablet 12   Mometasone  Furoate (ASMANEX  HFA) 200 MCG/ACT AERO Inhale 2 puffs into the lungs 2 (two) times daily. Rinse, gargle, and spit after use. 13 g 5   montelukast  (SINGULAIR ) 10 MG tablet Take 10 mg by mouth daily.     NP THYROID 90 MG tablet Take 90 mg by mouth daily.     No current facility-administered medications for this visit.   All other systems were reviewed with the patient and are negative.  PHYSICAL EXAMINATION: ECOG PERFORMANCE STATUS: 0 - Asymptomatic  Vitals:   05/05/24 0928  BP: 137/77  Pulse: 85  Resp: 18  Temp: 97.6 F (36.4 C)  SpO2: 100%   Filed Weights   05/05/24 0928  Weight: 167 lb 4.8 oz (75.9 kg)    GENERAL:alert, no distress and comfortable In the left breast lower outer quadrant,   LABORATORY DATA:  I have reviewed the data as listed Lab Results  Component Value Date   WBC 11.6 (H) 09/12/2019   HGB 11.5 (L) 09/12/2019   HCT 37.6 09/12/2019   MCV 74.5 (L) 09/12/2019   PLT 392 09/12/2019   Lab Results  Component Value Date   NA 140 09/12/2019   K 4.2 09/12/2019   CL 108 09/12/2019   CO2 23 09/12/2019    RADIOGRAPHIC STUDIES: I have personally reviewed the radiological reports and agreed with the findings in the report.  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 - Administer radiation therapy following surgery provided oncotype low. - Prescribe tamoxifen post-radiation for five years due to premenopausal status. - Discuss common side effects of  tamoxifen, including menopausal symptoms, vaginal discharge,risk of DVT/PE, endometrial hyperplasia and endometrial cancer. - Highlight potential improvement in bone density with tamoxifen. - Monitor for side effects of tamoxifen, including heavy menstruation and signs of blood clots. - Connect to breast cancer support group for additional resources and support. -She will RTC to review oncotype results and any additional recommendations.  Time spent: 45 min  All questions were answered. The patient knows to call the clinic with any problems, questions or concerns.    Amber Stalls, MD 05/05/24

## 2024-05-05 NOTE — Research (Signed)
 [employer] 2021-05 - Specimen Collection Study to Evaluate Biomarkers in Subjects with Cancer    Patient Kristina [name] was identified by Dr. Loretha as a potential candidate for the above listed study.  This Clinical Research Coordinator met with MADELON WELSCH, [id number], on 05/05/24 in a manner and location that ensures patient privacy to discuss participation in the above listed research study.  Patient is Accompanied by husband.  A copy of the informed consent document with embedded HIPAA language was provided to the patient.  Patient reads, speaks, and understands [place].   Patient was provided with the business card of this Coordinator and encouraged to contact the research team with any questions.  Approximately 10 minutes were spent with the patient reviewing the informed consent documents.  Patient was provided the option of taking informed consent documents home to review and was encouraged to review at their convenience with their support network, including other care providers. Patient took the consent documents home to review. Will follow patient on study interest.  Laury Quale, MPH  Clinical Research Coordinator

## 2024-05-05 NOTE — Assessment & Plan Note (Signed)
 SABRA

## 2024-05-05 NOTE — Progress Notes (Signed)
 [facility] Clinical Social Work  Initial Assessment   Kristina [name] is a 53 y.o. year old female accompanied by spouse, Tod. Clinical Social Work was referred by [facility] for assessment of psychosocial needs.   SDOH (Social Determinants of Health) assessments performed: Yes SDOH Interventions    Flowsheet Row Clinical Support from 05/05/2024 in [facility] - [facility] Dept Of [place]. [facility]  SDOH Interventions   Food Insecurity Interventions Intervention Not Indicated  Housing Interventions Intervention Not Indicated  Transportation Interventions Intervention Not Indicated  Utilities Interventions Intervention Not Indicated    SDOH Screenings   Food Insecurity: No Food Insecurity (05/05/2024)  Housing: Low Risk  (05/05/2024)  Transportation Needs: No Transportation Needs (05/05/2024)  Utilities: Not At Risk (05/05/2024)  Depression (PHQ2-9): Low Risk  (05/05/2024)  Tobacco Use: Medium Risk (05/05/2024)     Distress Screen completed: No     No data to display            Family/Social Information:  Housing Arrangement: patient lives with her husband, Tod. Have dogs, horses and other animals Family members/support persons in your life? 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  Income source: Employment Financial concerns: No Type of concern: None Food access concerns: no Religious or spiritual practice: Not known Advanced directives: No Services Currently in place:  [facility]  Coping/ Adjustment to diagnosis: Patient understands treatment plan and what happens next? yes, feels better after learning more and having a plan. Was starting to worry a lot, so stopped looking online and refocused on the animals around her Patient reported stressors: Anxiety/ nervousness and Adjusting to my illness Patient enjoys time with family/ friends and her work & animals Current  coping skills/ strengths: Capable of independent living , Manufacturing systems engineer , Special hobby/interest , and Supportive family/friends     SUMMARY: Current SDOH Barriers:  No major barriers identified today  Clinical Social Work Clinical Goal(s):  No clinical social work goals at this time  Interventions: Discussed common feeling and emotions when being diagnosed with cancer, and the importance of support during treatment Informed patient of the support team roles and support services at [facility] Provided CSW contact information and encouraged patient to call with any questions or concerns   Follow Up Plan: Patient will contact CSW with any support or resource needs Patient verbalizes understanding of plan: Yes    Marsh Heckler E Greggory Safranek, LCSW Clinical Social Worker [facility]

## 2024-05-05 NOTE — Progress Notes (Signed)
 REFERRING PROVIDER: Loretha Ash, MD  PRIMARY PROVIDER:  Jefferey Fitch, MD  PRIMARY REASON FOR VISIT:  1. Malignant neoplasm of upper-outer quadrant of left breast in female, estrogen receptor positive (HCC)    HISTORY OF PRESENT ILLNESS:   Kristina [name], a 53 y.o. female, was seen for a [place] cancer genetics consultation during the breast multidisciplinary clinic at the request of Dr. Loretha due to a personal history of cancer.  Kristina [name] presents to clinic today to discuss the possibility of a hereditary predisposition to cancer, to discuss genetic testing, and to further clarify her future cancer risks, as well as potential cancer risks for family members.   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  Past Medical History:  Diagnosis Date   Anxiety    Asthma    Depression    Thyroid disease     Past Surgical History:  Procedure Laterality Date   BREAST BIOPSY Left 04/27/2024   US  LT BREAST BX W LOC DEV 1ST LESION IMG BX SPEC US  GUIDE 04/27/2024 [facility] MAMMOGRAPHY   CHOLECYSTECTOMY  2009   ECTOPIC PREGNANCY SURGERY      Social History   Socioeconomic History   Marital status: Married    Spouse name: Not on file   Number of children: Not on file   Years of education: Not on file   Highest education level: Not on file  Occupational History   Not on file  Tobacco Use   Smoking status: Former   Smokeless tobacco: Never  Vaping Use   Vaping status: Never Used  Substance and Sexual Activity   Alcohol use: No   Drug use: No   Sexual activity: Not on file  Other Topics Concern   Not on file  Social History Narrative   Not on file   Social Drivers of Health   Financial Resource Strain: Not on file  Food Insecurity: Not on file  Transportation Needs: Not on file  Physical Activity: Not on file  Stress: Not on file  Social Connections: Not on file     FAMILY  HISTORY:  We obtained a detailed, 4-generation family history.  Significant diagnoses are listed below: Family History  Problem Relation Age of Onset   Cancer Maternal Cousin 21 - 20       unknown type     Kristina [name] is unaware of previous family history of genetic testing for hereditary cancer risks. There is no reported Ashkenazi Jewish ancestry.   GENETIC COUNSELING ASSESSMENT: Kristina [name] is a 53 y.o. female with a personal history of cancer which is somewhat suggestive of a hereditary cancer syndrome and predisposition to cancer given her young age at diagnosis. We, therefore, discussed and recommended the following at today's visit.   DISCUSSION: We discussed that 5 - 10% of cancer is hereditary, with most cases of hereditary breast cancer associated with mutations in BRCA1/2.  There are other genes that can be associated with hereditary breast cancer syndromes. Type of cancer risk and level of risk are gene-specific. We discussed that testing is beneficial for several reasons including knowing how to follow individuals after completing their treatment, identifying whether potential treatment options would be beneficial, and understanding if other family members could be at risk for cancer and allowing them to undergo genetic testing.   We reviewed the characteristics, features and inheritance patterns of hereditary cancer syndromes. We also discussed genetic  testing, including the appropriate family members to test, the process of testing, insurance coverage and turn-around-time for results. We discussed the implications of a negative, positive and/or variant of uncertain significant result. In order to get genetic test results in a timely manner so that Kristina [name] can use these genetic test results for surgical decisions, we recommended Kristina [name] pursue genetic testing for the BRCAplus panel. Once complete, we recommend Kristina [name] pursue reflex genetic testing to a more comprehensive gene panel.    Kristina [name] elected to have [facility] CancerNext-Expanded Panel. The CancerNext-Expanded gene panel offered by [facility] and includes sequencing, rearrangement, and RNA analysis for the following 77 genes: AIP, ALK, APC, ATM, AXIN2, BAP1, BARD1, BMPR1A, BRCA1, BRCA2, BRIP1, CDC73, CDH1, CDK4, CDKN1B, CDKN2A, CEBPA, CHEK2, CTNNA1, DDX41, DICER1, ETV6, FH, FLCN, GATA2, LZTR1, MAX, MBD4, MEN1, MET, MLH1, MSH2, MSH3, MSH6, MUTYH, NF1, NF2, NTHL1, PALB2, PHOX2B, PMS2, POT1, PRKAR1A, PTCH1, PTEN, RAD51C, RAD51D, RB1, RET, RPS20, RUNX1, SDHA, SDHAF2, SDHB, SDHC, SDHD, SMAD4, SMARCA4, SMARCB1, SMARCE1, STK11, SUFU, TMEM127, TP53, TSC1, TSC2, VHL, and WT1 (sequencing and deletion/duplication); EGFR, HOXB13, KIT, MITF, PDGFRA, POLD1, and POLE (sequencing only); EPCAM and GREM1 (deletion/duplication only).   We discussed with Kristina [name] that the personal and family history does not meet insurance criteria for testing. However, we are currently recommending genetic testing to all women diagnosed with breast cancer under age 57 at [facility]. Therefore, she may have an out of pocket cost. We discussed that if her out of pocket cost for testing is over $100, the laboratory should contact them to discuss self-pay prices, patient pay assistance programs, if applicable, and other billing options.   PLAN: After considering the risks, benefits, and limitations, Kristina [name] provided informed consent to pursue genetic testing and the blood sample was sent to [facility] for analysis of the CancerNext-Expanded Panel. Results should be available within approximately 1-2 weeks' time, at which point they will be disclosed by telephone to Kristina [name], as will any additional recommendations warranted by these results. Kristina [name] will receive a summary of her genetic counseling visit and a copy of her results once available. This information will also be available in Epic.   Kristina [name] questions were answered to her satisfaction  today. Our contact information was provided should additional questions or concerns arise. Thank you for the referral and allowing us  to share in the care of your patient.   Rabia Argote, MS, [facility] Genetic Counselor [email or web address] (P) [phone]  30 minutes were spent on the date of the encounter in service to the patient including preparation, face-to-face consultation, documentation and care coordination.  The patient brought her husband. Drs. Gudena and/or Lanny were available to discuss this case as needed.  _______________________________________________________________________ For Office Staff:  Number of people involved in session: 2 Was an Intern/ student involved with case: no

## 2024-05-07 ENCOUNTER — Encounter: Payer: Self-pay | Admitting: Radiation Oncology

## 2024-05-07 ENCOUNTER — Other Ambulatory Visit: Payer: Self-pay | Admitting: Surgery

## 2024-05-07 DIAGNOSIS — Z853 Personal history of malignant neoplasm of breast: Secondary | ICD-10-CM

## 2024-05-08 ENCOUNTER — Encounter (INDEPENDENT_AMBULATORY_CARE_PROVIDER_SITE_OTHER): Payer: Self-pay

## 2024-05-09 NOTE — Therapy (Signed)
 OUTPATIENT PHYSICAL THERAPY BREAST CANCER BASELINE EVALUATION   Patient Name: Kristina [name] MRN: [id number] DOB:December 14, 1970, 53 y.o., female Today's Date: 05/10/2024  END OF SESSION:  PT End of Session - 05/10/24 1630     Visit Number 1    Number of Visits 2    Date for PT Re-Evaluation 06/10/24    Authorization Type no self care charge allowed    PT Start Time 1600    PT Stop Time 1630    PT Time Calculation (min) 30 min    Activity Tolerance Patient tolerated treatment well    Behavior During Therapy [facility] for tasks assessed/performed          Past Medical History:  Diagnosis Date   Anxiety    Asthma    Depression    Thyroid disease    Past Surgical History:  Procedure Laterality Date   BREAST BIOPSY Left 04/27/2024   US  LT BREAST BX W LOC DEV 1ST LESION IMG BX SPEC US  GUIDE 04/27/2024 [facility] MAMMOGRAPHY   CHOLECYSTECTOMY  2009   ECTOPIC PREGNANCY SURGERY     Patient Active Problem List   Diagnosis Date Noted   Malignant neoplasm of upper-outer quadrant of left breast in female, estrogen receptor positive (HCC) 05/03/2024   Pneumonia due to COVID-19 virus 09/08/2019   Acute respiratory failure with hypoxia (HCC) 09/08/2019   Hypokalemia 09/08/2019   Moderate persistent asthma 06/22/2015   Other allergic rhinitis 06/22/2015   Laryngopharyngeal reflux (LPR) 06/22/2015    PCP: Aleck Milch, MD  REFERRING PROVIDER: Vicenta Poli, MD  REFERRING DIAG:  Diagnosis  C50.412,Z17.0 (ICD-10-CM) - Malignant neoplasm of upper-outer quadrant of left breast in female, estrogen receptor positive (HCC)    THERAPY DIAG:  Malignant neoplasm of upper-outer quadrant of left breast in female, estrogen receptor positive (HCC)  Abnormal posture  Rationale for Evaluation and Treatment: Rehabilitation  ONSET DATE: 05/03/24  SUBJECTIVE:                                                                                                                                                                                            SUBJECTIVE STATEMENT: Patient reports she is here today to be seen by her medical team for her newly diagnosed left breast cancer.   PERTINENT HISTORY:  Patient was diagnosed with left grade 2 invasive mammary carcinoma. It measures 1.5 cm and is located in the upper outer quadrant. It is ER/PR positive and HER2 negative with a Ki67 of 5%. Will have a lumpectomy on 05/13/24 with SLNB. Will have radiation.    PATIENT GOALS:   reduce lymphedema risk and learn post op HEP.  PAIN:  Are you having pain? No  PRECAUTIONS: Active CA None  RED FLAGS: None   HAND DOMINANCE: right  WEIGHT BEARING RESTRICTIONS: No  FALLS:  Has patient fallen in last 6 months? No  LIVING ENVIRONMENT: Patient lives with: husband   OCCUPATION: runs a dog boarding facility  LEISURE: walks, has horses, has to lift 50# horse food bags.    PRIOR LEVEL OF FUNCTION: Independent   OBJECTIVE: Note: Objective measures were completed at Evaluation unless otherwise noted.  COGNITION: Overall cognitive status: Within functional limits for tasks assessed    POSTURE:  Forward head and rounded shoulders posture  UPPER EXTREMITY AROM/PROM:  A/PROM RIGHT   eval   Shoulder extension 70  Shoulder flexion 165  Shoulder abduction 165  Shoulder internal rotation 70  Shoulder external rotation 100    (Blank rows = not tested)  A/PROM LEFT   eval  Shoulder extension 60  Shoulder flexion 160  Shoulder abduction 165  Shoulder internal rotation 80  Shoulder external rotation 90    (Blank rows = not tested)  CERVICAL AROM: All within normal limits:   UPPER EXTREMITY STRENGTH: all 5/5 without pain    LYMPHEDEMA ASSESSMENTS (in cm):   LANDMARK RIGHT   eval  10 cm proximal to olecranon process 30.8  Olecranon process 26.5  10 cm proximal to ulnar styloid process 21  Just proximal to ulnar styloid process 16.5  Across hand at thumb web space 19.8  At base  of 2nd digit 6.4  (Blank rows = not tested)  LANDMARK LEFT   eval  10 cm proximal to olecranon process 30.6  Olecranon process 26.5  10 cm proximal to ulnar styloid process 22.1  Just proximal to ulnar styloid process 16.3  Across hand at thumb web space 18.9  At base of 2nd digit 6.1  (Blank rows = not tested)  L-DEX LYMPHEDEMA SCREENING: The patient was assessed using the L-Dex machine today to produce a lymphedema index baseline score. The patient will be reassessed on a regular basis (typically every 3 months) to obtain new L-Dex scores. If the score is > 6.5 points away from his/her baseline score indicating onset of subclinical lymphedema, it will be recommended to wear a compression garment for 4 weeks, 12 hours per day and then be reassessed. If the score continues to be > 6.5 points from baseline at reassessment, we will initiate lymphedema treatment. Assessing in this manner has a 95% rate of preventing clinically significant lymphedema.   QUICK DASH SURVEY: 0%  PATIENT EDUCATION:  Education details: Time spent educating patient on aspects of self-care to maximize post op recovery. Patient was educated on where and how to get a post op compression bra to use to reduce post op edema. Patient was also educated on the use of SOZO screenings and surveillance principles for early identification of lymphedema onset. She was instructed to use the post op pillow in the axilla for pressure and pain relief. Patient educated on lymphedema risk reduction and post op shoulder/posture HEP. Person educated: Patient Education method: Explanation, Demonstration, Handout Education comprehension: Patient verbalized understanding and returned demonstration  HOME EXERCISE PROGRAM: Patient was instructed today in a home exercise program today for post op shoulder range of motion. These included active assist shoulder flexion in sitting, scapular retraction, wall walking with shoulder abduction, and  hands behind head external rotation.  She was encouraged to do these twice a day, holding 3 seconds and repeating 5 times when permitted by her  physician.   ASSESSMENT:  CLINICAL IMPRESSION: Pt will benefit from a post op PT reassessment to determine needs and from L-Dex screens every 3 months for 2 years to detect subclinical lymphedema.  Pt will benefit from skilled therapeutic intervention to improve on the following deficits: Decreased knowledge of precautions, impaired UE functional use, pain, decreased ROM, postural dysfunction.   PT treatment/interventions: ADL/self-care home management, pt/family education, therapeutic exercise  REHAB POTENTIAL: Excellent  CLINICAL DECISION MAKING: Stable/uncomplicated  EVALUATION COMPLEXITY: Low   GOALS: Goals reviewed with patient? YES  LONG TERM GOALS: (STG=LTG)    Name Target Date Goal status  1 Pt will be able to verbalize understanding of pertinent lymphedema risk reduction practices relevant to her dx specifically related to skin care.  Baseline:  No knowledge 05/10/2024 Achieved at eval  2 Pt will be able to return demo and/or verbalize understanding of the post op HEP related to regaining shoulder ROM. Baseline:  No knowledge 05/10/2024 Achieved at eval  3 Pt will be able to verbalize understanding of the importance of viewing the post op After Breast CA Class video for further lymphedema risk reduction education and therapeutic exercise.  Baseline:  No knowledge 05/10/2024 Achieved at eval  4 Pt will demo she has regained full shoulder ROM and function post operatively compared to baselines.  Baseline: See objective measurements taken today. 06/10/24     PLAN:  PT FREQUENCY/DURATION: EVAL and 1 follow up appointment.   PLAN FOR NEXT SESSION: will reassess 3-4 weeks post op to determine needs.   Patient will follow up at outpatient cancer rehab 3-4 weeks following surgery.  If the patient requires physical therapy at that time, a  specific plan will be dictated and sent to the referring physician for approval. The patient was educated today on appropriate basic range of motion exercises to begin post operatively and the importance of viewing the After Breast Cancer class video following surgery.  Patient was educated today on lymphedema risk reduction practices as it pertains to recommendations that will benefit the patient immediately following surgery.  She verbalized good understanding.    Physical Therapy Information for After Breast Cancer Surgery/Treatment:  Lymphedema is a swelling condition that you may be at risk for in your arm if you have lymph nodes removed from the armpit area.  After a sentinel node biopsy, the risk is approximately 5-9% and is higher after an axillary node dissection.  There is treatment available for this condition and it is not life-threatening.  Contact your physician or physical therapist with concerns. You may begin the 4 shoulder/posture exercises (see additional sheet) when permitted by your physician (typically a week after surgery).  If you have drains, you may need to wait until those are removed before beginning range of motion exercises.  A general recommendation is to not lift your arms above shoulder height until drains are removed.  These exercises should be done to your tolerance and gently.  This is not a no pain/no gain type of recovery so listen to your body and stretch into the range of motion that you can tolerate, stopping if you have pain.  If you are having immediate reconstruction, ask your plastic surgeon about doing exercises as he or she may want you to wait. We encourage you to view the After Breast Cancer class video following surgery.  You will learn information related to lymphedema risk, prevention and treatment and additional exercises to regain mobility following surgery.   While undergoing any medical procedure  or treatment, try to avoid blood pressure being taken  or needle sticks from occurring on the arm on the side of cancer.   This recommendation begins after surgery and continues for the rest of your life.  This may help reduce your risk of getting lymphedema (swelling in your arm). An excellent resource for those seeking information on lymphedema is the National Lymphedema Network's web site. It can be accessed at www.lymphnet.org If you notice swelling in your hand, arm or breast at any time following surgery (even if it is many years from now), please contact your doctor or physical therapist to discuss this.  Lymphedema can be treated at any time but it is easier for you if it is treated early on.  If you feel like your shoulder motion is not returning to normal in a reasonable amount of time, please contact your surgeon or physical therapist.  [facility] [phone]. [address], [place] [address]  ABC CLASS After Breast Cancer Class  After Breast Cancer Class is a specially designed exercise class video to assist you in a safe recover after having breast cancer surgery.  In this video you will learn how to get back to full function whether your drains were just removed or if you had surgery a month ago. The video can be viewed on this page: [email or web address].boyd-meyer.org/ or on YouTube here: https://[email or web address].  Class Goals  Understand specific stretches to improve the flexibility of you chest and shoulder. Learn ways to safely strengthen your upper body and improve your posture. Understand the warning signs of infection and why you may be at risk for an arm infection. Learn about Lymphedema and prevention.  ** You do not need to view this video until after surgery.  Drains should be removed to participate in the recommended exercises on the video.  Patient was instructed today in a home exercise program today for post op shoulder  range of motion. These included active assist shoulder flexion in sitting, scapular retraction, wall walking with shoulder abduction, and hands behind head external rotation.  She was encouraged to do these twice a day, holding 3 seconds and repeating 5 times when permitted by her physician.    Larue Saddie SAUNDERS, PT 05/10/2024, 4:31 PM

## 2024-05-10 ENCOUNTER — Encounter: Payer: Self-pay | Admitting: Rehabilitation

## 2024-05-10 ENCOUNTER — Ambulatory Visit: Attending: Surgery | Admitting: Rehabilitation

## 2024-05-10 ENCOUNTER — Other Ambulatory Visit: Payer: Self-pay

## 2024-05-10 ENCOUNTER — Encounter: Payer: Self-pay | Admitting: Hematology and Oncology

## 2024-05-10 ENCOUNTER — Telehealth: Payer: Self-pay

## 2024-05-10 DIAGNOSIS — Z170 Estrogen receptor positive status [ER+]: Secondary | ICD-10-CM | POA: Diagnosis present

## 2024-05-10 DIAGNOSIS — C50412 Malignant neoplasm of upper-outer quadrant of left female breast: Secondary | ICD-10-CM | POA: Diagnosis present

## 2024-05-10 DIAGNOSIS — R293 Abnormal posture: Secondary | ICD-10-CM | POA: Insufficient documentation

## 2024-05-10 NOTE — Telephone Encounter (Signed)
 [employer] 2021-05 - Specimen Collection Study to Evaluate Biomarkers in Subjects with Cancer    Reached out to patient via phone to follow up on study interest. Patient declined study participation. Patient mentioned surgery is scheduled in a few days with several appointments scheduled each day leading to surgery. Prefers to focus on primary clinical care needs. Patient has contact information if she has any questions. Dr. Loretha notified.  Laury Quale, MPH  Clinical Research Coordinator

## 2024-05-11 ENCOUNTER — Other Ambulatory Visit: Payer: Self-pay | Admitting: Surgery

## 2024-05-11 ENCOUNTER — Encounter: Payer: Self-pay | Admitting: Genetic Counselor

## 2024-05-11 ENCOUNTER — Ambulatory Visit
Admission: RE | Admit: 2024-05-11 | Discharge: 2024-05-11 | Disposition: A | Discharge status: Home / Self Care | Source: Ambulatory Visit | Attending: Surgery | Admitting: Surgery

## 2024-05-11 ENCOUNTER — Telehealth: Payer: Self-pay | Admitting: Genetic Counselor

## 2024-05-11 DIAGNOSIS — Z1379 Encounter for other screening for genetic and chromosomal anomalies: Secondary | ICD-10-CM | POA: Insufficient documentation

## 2024-05-11 DIAGNOSIS — Z853 Personal history of malignant neoplasm of breast: Secondary | ICD-10-CM

## 2024-05-11 HISTORY — PX: BREAST BIOPSY: SHX20

## 2024-05-11 NOTE — Telephone Encounter (Signed)
 I contacted Kristina [name] to discuss her genetic testing results. No pathogenic variants were identified in the 77 genes analyzed. Of note, a variant of uncertain significance was identified in the POLE gene. Detailed clinic note to follow.  The test report has been scanned into EPIC and is located under the Molecular Pathology section of the Results Review tab.  A portion of the result report is included below for reference.   Kristina Horne, MS, [facility] Genetic Counselor [email or web address] (P) [phone]

## 2024-05-11 NOTE — Pre-Procedure Instructions (Signed)
 Surgical Instructions   Your procedure is scheduled on May 13, 2024. Report to [facility] Main Entrance A at 12:15 P.M., then check in with the Admitting office. Any questions or running late day of surgery: call [phone]  Questions prior to your surgery date: call [phone], Monday-Friday, 8am-4pm. If you experience any cold or flu symptoms such as cough, fever, chills, shortness of breath, etc. between now and your scheduled surgery, please notify us  at the above number.     Remember:  Do not eat after midnight the night before your surgery   You may drink clear liquids until 11:15 AM the morning of your surgery.   Clear liquids allowed are: Water, Non-Citrus Juices (without pulp), Carbonated Beverages, Clear Tea (no milk, honey, etc.), Black Coffee Only (NO MILK, CREAM OR POWDERED CREAMER of any kind), and Gatorade.  Patient Instructions  The night before surgery:  No food after midnight. ONLY clear liquids after midnight  The day of surgery (if you do NOT have diabetes):  Drink ONE (1) Pre-Surgery Clear Ensure by 11:15 AM the morning of surgery. Drink in one sitting. Do not sip.  This drink was given to you during your hospital  pre-op appointment visit.  Nothing else to drink after completing the  Pre-Surgery Clear Ensure.         If you have questions, please contact your surgeon's office.    Take these medicines the morning of surgery with A SIP OF WATER: fexofenadine (ALLEGRA)  levothyroxine (SYNTHROID)    May take these medicines IF NEEDED: Albuterol  Sulfate (PROAIR  RESPICLICK) inhaler - please bring inhaler with you morning of surgery   One week prior to surgery, STOP taking any Aspirin (unless otherwise instructed by your surgeon) Aleve, Naproxen, Ibuprofen, Motrin, Advil, Goody's, BC's, all herbal medications, fish oil, and non-prescription vitamins.                     Do NOT Smoke (Tobacco/Vaping) for 24 hours prior to your procedure.  If you  use a CPAP at night, you may bring your mask/headgear for your overnight stay.   You will be asked to remove any contacts, glasses, piercing's, hearing aid's, dentures/partials prior to surgery. Please bring cases for these items if needed.    Patients discharged the day of surgery will not be allowed to drive home, and someone needs to stay with them for 24 hours.  SURGICAL WAITING ROOM VISITATION Patients may have no more than 2 support people in the waiting area - these visitors may rotate.   Pre-op nurse will coordinate an appropriate time for 1 ADULT support person, who may not rotate, to accompany patient in pre-op.  Children under the age of 34 must have an adult with them who is not the patient and must remain in the main waiting area with an adult.  If the patient needs to stay at the hospital during part of their recovery, the visitor guidelines for inpatient rooms apply.  Please refer to the [facility] website for the visitor guidelines for any additional information.   If you received a COVID test during your pre-op visit  it is requested that you wear a mask when out in public, stay away from anyone that may not be feeling well and notify your surgeon if you develop symptoms. If you have been in contact with anyone that has tested positive in the last 10 days please notify you surgeon.      Pre-operative CHG Bathing Instructions  You can play a key role in reducing the risk of infection after surgery. Your skin needs to be as free of germs as possible. You can reduce the number of germs on your skin by washing with CHG (chlorhexidine  gluconate) soap before surgery. CHG is an antiseptic soap that kills germs and continues to kill germs even after washing.   DO NOT use if you have an allergy to chlorhexidine /CHG or antibacterial soaps. If your skin becomes reddened or irritated, stop using the CHG and notify one of our RNs at [phone].              TAKE A SHOWER THE NIGHT  BEFORE SURGERY AND THE DAY OF SURGERY    Please keep in mind the following:  DO NOT shave, including legs and underarms, 48 hours prior to surgery.   You may shave your face before/day of surgery.  Place clean sheets on your bed the night before surgery Use a clean washcloth (not used since being washed) for each shower. DO NOT sleep with pet's night before surgery.  CHG Shower Instructions:  Wash your face and private area with normal soap. If you choose to wash your hair, wash first with your normal shampoo.  After you use shampoo/soap, rinse your hair and body thoroughly to remove shampoo/soap residue.  Turn the water OFF and apply half the bottle of CHG soap to a CLEAN washcloth.  Apply CHG soap ONLY FROM YOUR NECK DOWN TO YOUR TOES (washing for 3-5 minutes)  DO NOT use CHG soap on face, private areas, open wounds, or sores.  Pay special attention to the area where your surgery is being performed.  If you are having back surgery, having someone wash your back for you may be helpful. Wait 2 minutes after CHG soap is applied, then you may rinse off the CHG soap.  Pat dry with a clean towel  Put on clean pajamas    Additional instructions for the day of surgery: DO NOT APPLY any lotions, deodorants, cologne, or perfumes.   Do not wear jewelry or makeup Do not wear nail polish, gel polish, artificial nails, or any other type of covering on natural nails (fingers and toes) Do not bring valuables to the hospital. [facility] is not responsible for valuables/personal belongings. Put on clean/comfortable clothes.  Please brush your teeth.  Ask your nurse before applying any prescription medications to the skin.

## 2024-05-12 ENCOUNTER — Encounter (HOSPITAL_COMMUNITY): Payer: Self-pay

## 2024-05-12 ENCOUNTER — Encounter (HOSPITAL_COMMUNITY)
Admission: RE | Admit: 2024-05-12 | Discharge: 2024-05-12 | Disposition: A | Discharge status: Home / Self Care | Source: Ambulatory Visit | Attending: Surgery | Admitting: Surgery

## 2024-05-12 ENCOUNTER — Other Ambulatory Visit: Payer: Self-pay

## 2024-05-12 DIAGNOSIS — Z01818 Encounter for other preprocedural examination: Secondary | ICD-10-CM | POA: Diagnosis present

## 2024-05-12 HISTORY — DX: Other specified postprocedural states: Z98.890

## 2024-05-12 HISTORY — DX: Other complications of anesthesia, initial encounter: T88.59XA

## 2024-05-12 NOTE — H&P (Signed)
 REFERRING PHYSICIAN: Loretha Linnette Lane Mamie* PROVIDER: VICENTA DASIE POLI, MD MRN: [id number] DOB: 1971/05/09 DATE OF ENCOUNTER: 05/05/2024 Subjective   Chief Complaint: Breast Cancer  History of Present Illness: Kristina [name] is a 53 y.o. female who is seen today as an office consultation for evaluation of Breast Cancer  This is a pleasant 53 year old female who was found on recent screen mammography to have a defect in the left breast. An ultrasound showed a 1.5 cm mass. Ultrasound of the left axilla was negative. 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 It was 90% ER positive, 9% PR positive, HER2 negative, and a Ki-67 of 5%. She has had no previous problems regarding her breast but has had frequent mammograms for calcifications. There is no family history of breast cancer. She has no issues with general anesthesia. She has asthma but no other cardiopulmonary complaints  Review of Systems: A complete review of systems was obtained from the patient. I have reviewed this information and discussed as appropriate with the patient. See HPI as well for other ROS.  ROS   Medical History: Past Medical History:  Diagnosis Date  Asthma, unspecified asthma severity, unspecified whether complicated, unspecified whether persistent (HHS-HCC)  History of cancer  Thyroid disease   Patient Active Problem List  Diagnosis  Malignant neoplasm of upper-outer quadrant of left breast in female, estrogen receptor positive (CMS/HHS-HCC)   Past Surgical History:  Procedure Laterality Date  .Left Breast Biopsy Left 04/27/2024  CHOLECYSTECTOMY  2009  Ectopic Pregnancy Surgery    No Known Allergies  Current Outpatient Medications on File Prior to Visit  Medication Sig Dispense Refill  albuterol  MDI, PROVENTIL , VENTOLIN , PROAIR , HFA 90 mcg/actuation inhaler Inhale 1-2 inhalations into the lungs every 4 (four) hours as  needed for Shortness of Breath or Wheezing (every 4-6 hours as needed)  levothyroxine (SYNTHROID) 125 MCG tablet Take 125 mcg by mouth once daily In the morning on an empty stomach   No current facility-administered medications on file prior to visit.   Family History  Problem Relation Age of Onset  Diabetes Father  Other (heart surgery) Father  Diabetes Brother    Social History   Tobacco Use  Smoking Status Never  Smokeless Tobacco Never    Social History   Socioeconomic History  Marital status: Married  Tobacco Use  Smoking status: Never  Smokeless tobacco: Never  Vaping Use  Vaping status: Never Used  Substance and Sexual Activity  Alcohol use: Never  Drug use: Never   Objective:  There were no vitals filed for this visit.  There is no height or weight on file to calculate BMI.  Physical Exam   She appears well on exam  There are no palpable breast masses in either breast. There is minimal ecchymosis from her left breast biopsy. The nipple-areolar complexes are normal. There is no axillary adenopathy  Labs, Imaging and Diagnostic Testing: I have reviewed her mammograms, ultrasound, and pathology results  Assessment and Plan:   Diagnoses and all orders for this visit:  Primary invasive malignant neoplasm of left female breast (CMS/HHS-HCC)   We have discussed her multidisciplinary breast cancer conference. From a surgical standpoint I discussed surgery for breast cancer. We discussed breast conservation versus mastectomy and the long-term results of each. She wishes to proceed with breast conservation. We next discussed proceeding with a radioactive seed guided left breast lumpectomy and sentinel lymph node biopsy. I explained the surgical  procedures in detail. We discussed the risks which includes but is not limited to bleeding, infection, the need for further surgery if margins or lymph nodes are positive, chronic lymphedema, seroma formation, injury to  surrounding structures, chronic pain, cardiopulmonary issues with anesthesia, blood clots, postoperative recovery, etc. She understands and wishes to proceed with surgery which will be scheduled

## 2024-05-12 NOTE — Progress Notes (Signed)
 PCP - Dr. Aleck Milch or Thersia Holt at [facility] Cardiologist -  Oncologist: Dr. Margherita Stalls  PPM/ICD - denies Device Orders - na Rep Notified - na  Chest x-ray - na EKG - na Stress Test -  ECHO -  Cardiac Cath -   Sleep Study - denies CPAP - na  Non-diabetic  Blood Thinner Instructions: denies Aspirin Instructions:denies  ERAS Protcol - Ensure until 1115  Anesthesia review: Yes. Breast seed placement  Patient denies shortness of breath, fever, cough and chest pain at PAT appointment   All instructions explained to the patient, with a verbal understanding of the material. Patient agrees to go over the instructions while at home for a better understanding. Patient also instructed to self quarantine after being tested for COVID-19. The opportunity to ask questions was provided.

## 2024-05-13 ENCOUNTER — Ambulatory Visit: Payer: Self-pay | Admitting: Genetic Counselor

## 2024-05-13 ENCOUNTER — Other Ambulatory Visit: Payer: Self-pay

## 2024-05-13 ENCOUNTER — Encounter (HOSPITAL_COMMUNITY): Payer: Self-pay | Admitting: Surgery

## 2024-05-13 ENCOUNTER — Ambulatory Visit (HOSPITAL_COMMUNITY): Admitting: Registered Nurse

## 2024-05-13 ENCOUNTER — Encounter (HOSPITAL_COMMUNITY)
Admission: RE | Disposition: A | Discharge status: Home / Self Care | Payer: Self-pay | Source: Home / Self Care | Attending: Surgery

## 2024-05-13 ENCOUNTER — Ambulatory Visit
Admission: RE | Admit: 2024-05-13 | Discharge: 2024-05-13 | Disposition: A | Discharge status: Home / Self Care | Source: Ambulatory Visit | Attending: Surgery | Admitting: Surgery

## 2024-05-13 ENCOUNTER — Ambulatory Visit (HOSPITAL_COMMUNITY)
Admission: RE | Admit: 2024-05-13 | Discharge: 2024-05-13 | Disposition: A | Discharge status: Home / Self Care | Attending: Surgery | Admitting: Surgery

## 2024-05-13 DIAGNOSIS — Z1379 Encounter for other screening for genetic and chromosomal anomalies: Secondary | ICD-10-CM

## 2024-05-13 DIAGNOSIS — Z170 Estrogen receptor positive status [ER+]: Secondary | ICD-10-CM | POA: Insufficient documentation

## 2024-05-13 DIAGNOSIS — Z1721 Progesterone receptor positive status: Secondary | ICD-10-CM | POA: Insufficient documentation

## 2024-05-13 DIAGNOSIS — Z87891 Personal history of nicotine dependence: Secondary | ICD-10-CM | POA: Diagnosis not present

## 2024-05-13 DIAGNOSIS — C50912 Malignant neoplasm of unspecified site of left female breast: Secondary | ICD-10-CM | POA: Diagnosis not present

## 2024-05-13 DIAGNOSIS — J45909 Unspecified asthma, uncomplicated: Secondary | ICD-10-CM | POA: Insufficient documentation

## 2024-05-13 DIAGNOSIS — Z17411 Hormone receptor positive with human epidermal growth factor receptor 2 negative status: Secondary | ICD-10-CM | POA: Insufficient documentation

## 2024-05-13 DIAGNOSIS — C50412 Malignant neoplasm of upper-outer quadrant of left female breast: Secondary | ICD-10-CM | POA: Insufficient documentation

## 2024-05-13 DIAGNOSIS — Z853 Personal history of malignant neoplasm of breast: Secondary | ICD-10-CM

## 2024-05-13 HISTORY — PX: BREAST LUMPECTOMY WITH RADIOACTIVE SEED AND SENTINEL LYMPH NODE BIOPSY: SHX6550

## 2024-05-13 LAB — POCT PREGNANCY, URINE: Preg Test, Ur: NEGATIVE

## 2024-05-13 SURGERY — BREAST LUMPECTOMY WITH RADIOACTIVE SEED AND SENTINEL LYMPH NODE BIOPSY
Anesthesia: General | Site: Breast | Laterality: Left

## 2024-05-13 MED ORDER — FENTANYL CITRATE (PF) 250 MCG/5ML IJ SOLN
INTRAMUSCULAR | Status: DC | PRN
  Administered 2024-05-13 (×6): 25 ug via INTRAVENOUS

## 2024-05-13 MED ORDER — LACTATED RINGERS IV SOLN: INTRAVENOUS | Status: DC

## 2024-05-13 MED ORDER — CHLORHEXIDINE GLUCONATE CLOTH 2 % EX PADS: 6.0000 | MEDICATED_PAD | Freq: Once | CUTANEOUS | Status: DC

## 2024-05-13 MED ORDER — PROPOFOL 10 MG/ML IV BOLUS
INTRAVENOUS | Status: AC
  Filled 2024-05-13: qty 20

## 2024-05-13 MED ORDER — ONDANSETRON HCL 4 MG/2ML IJ SOLN
INTRAMUSCULAR | Status: DC | PRN
  Administered 2024-05-13: 4 mg via INTRAVENOUS

## 2024-05-13 MED ORDER — BUPIVACAINE HCL (PF) 0.5 % IJ SOLN
INTRAMUSCULAR | Status: DC | PRN
Start: 2024-05-13 — End: 2024-05-13
  Administered 2024-05-13: 20 mL via PERINEURAL

## 2024-05-13 MED ORDER — BUPIVACAINE-EPINEPHRINE (PF) 0.25% -1:200000 IJ SOLN
INTRAMUSCULAR | Status: AC
  Filled 2024-05-13: qty 30

## 2024-05-13 MED ORDER — CEFAZOLIN SODIUM-DEXTROSE 2-4 GM/100ML-% IV SOLN
INTRAVENOUS | Status: AC
  Filled 2024-05-13: qty 100

## 2024-05-13 MED ORDER — CEFAZOLIN SODIUM-DEXTROSE 2-4 GM/100ML-% IV SOLN
2.0000 g | INTRAVENOUS | Status: AC
  Administered 2024-05-13: 2 g via INTRAVENOUS

## 2024-05-13 MED ORDER — MIDAZOLAM HCL 2 MG/2ML IJ SOLN: 2.0000 mg | Freq: Once | INTRAMUSCULAR | Status: AC

## 2024-05-13 MED ORDER — SCOPOLAMINE 1 MG/3DAYS TD PT72
1.0000 | MEDICATED_PATCH | TRANSDERMAL | Status: DC
  Administered 2024-05-13: 1 mg via TRANSDERMAL
  Filled 2024-05-13: qty 1

## 2024-05-13 MED ORDER — ACETAMINOPHEN 500 MG PO TABS: 1000.0000 mg | ORAL_TABLET | ORAL | Status: AC

## 2024-05-13 MED ORDER — MIDAZOLAM HCL 2 MG/2ML IJ SOLN
INTRAMUSCULAR | Status: AC
Start: 2024-05-13 — End: 2024-05-13
  Filled 2024-05-13: qty 2

## 2024-05-13 MED ORDER — MAGTRACE LYMPHATIC TRACER
INTRAMUSCULAR | Status: DC | PRN
  Administered 2024-05-13: 1 mL via INTRAMUSCULAR

## 2024-05-13 MED ORDER — MIDAZOLAM HCL 2 MG/2ML IJ SOLN
INTRAMUSCULAR | Status: AC
  Administered 2024-05-13: 2 mg via INTRAVENOUS
  Filled 2024-05-13: qty 2

## 2024-05-13 MED ORDER — ONDANSETRON HCL 4 MG/2ML IJ SOLN
INTRAMUSCULAR | Status: AC
Start: 2024-05-13 — End: 2024-05-13
  Filled 2024-05-13: qty 2

## 2024-05-13 MED ORDER — PROPOFOL 10 MG/ML IV BOLUS
INTRAVENOUS | Status: DC | PRN
  Administered 2024-05-13: 200 mg via INTRAVENOUS
  Administered 2024-05-13: 125 ug/kg/min via INTRAVENOUS

## 2024-05-13 MED ORDER — ORAL CARE MOUTH RINSE: 15.0000 mL | Freq: Once | OROMUCOSAL | Status: AC

## 2024-05-13 MED ORDER — CHLORHEXIDINE GLUCONATE 0.12 % MT SOLN
OROMUCOSAL | Status: AC
  Administered 2024-05-13: 15 mL via OROMUCOSAL
  Filled 2024-05-13: qty 15

## 2024-05-13 MED ORDER — 0.9 % SODIUM CHLORIDE (POUR BTL) OPTIME
TOPICAL | Status: DC | PRN
  Administered 2024-05-13: 1000 mL

## 2024-05-13 MED ORDER — BUPIVACAINE-EPINEPHRINE 0.25% -1:200000 IJ SOLN
INTRAMUSCULAR | Status: DC | PRN
  Administered 2024-05-13: 17 mL

## 2024-05-13 MED ORDER — MIDAZOLAM HCL 2 MG/2ML IJ SOLN
INTRAMUSCULAR | Status: DC | PRN
  Administered 2024-05-13 (×2): 1 mg via INTRAVENOUS

## 2024-05-13 MED ORDER — ONDANSETRON HCL 4 MG/2ML IJ SOLN: 4.0000 mg | Freq: Once | INTRAMUSCULAR | Status: DC | PRN

## 2024-05-13 MED ORDER — LIDOCAINE 2% (20 MG/ML) 5 ML SYRINGE
INTRAMUSCULAR | Status: AC
Start: 2024-05-13 — End: 2024-05-13
  Filled 2024-05-13: qty 5

## 2024-05-13 MED ORDER — LIDOCAINE 2% (20 MG/ML) 5 ML SYRINGE
INTRAMUSCULAR | Status: DC | PRN
  Administered 2024-05-13: 100 mg via INTRAVENOUS

## 2024-05-13 MED ORDER — HYDROMORPHONE HCL 1 MG/ML IJ SOLN: 0.2500 mg | INTRAMUSCULAR | Status: DC | PRN

## 2024-05-13 MED ORDER — FENTANYL CITRATE (PF) 100 MCG/2ML IJ SOLN: 50.0000 ug | Freq: Once | INTRAMUSCULAR | Status: AC

## 2024-05-13 MED ORDER — OXYCODONE HCL 5 MG/5ML PO SOLN: 5.0000 mg | Freq: Once | ORAL | Status: DC | PRN

## 2024-05-13 MED ORDER — FENTANYL CITRATE (PF) 250 MCG/5ML IJ SOLN
INTRAMUSCULAR | Status: AC
  Filled 2024-05-13: qty 5

## 2024-05-13 MED ORDER — DEXAMETHASONE SODIUM PHOSPHATE 10 MG/ML IJ SOLN
INTRAMUSCULAR | Status: DC | PRN
  Administered 2024-05-13: 10 mg via INTRAVENOUS

## 2024-05-13 MED ORDER — ENSURE PRE-SURGERY PO LIQD: 296.0000 mL | Freq: Once | ORAL | Status: DC

## 2024-05-13 MED ORDER — BUPIVACAINE LIPOSOME 1.3 % IJ SUSP
INTRAMUSCULAR | Status: DC | PRN
Start: 2024-05-13 — End: 2024-05-13
  Administered 2024-05-13: 10 mL via PERINEURAL

## 2024-05-13 MED ORDER — CHLORHEXIDINE GLUCONATE 0.12 % MT SOLN: 15.0000 mL | Freq: Once | OROMUCOSAL | Status: AC

## 2024-05-13 MED ORDER — DROPERIDOL 2.5 MG/ML IJ SOLN: 0.6250 mg | Freq: Once | INTRAMUSCULAR | Status: DC | PRN

## 2024-05-13 MED ORDER — DEXAMETHASONE SODIUM PHOSPHATE 10 MG/ML IJ SOLN
INTRAMUSCULAR | Status: AC
  Filled 2024-05-13: qty 1

## 2024-05-13 MED ORDER — FENTANYL CITRATE (PF) 100 MCG/2ML IJ SOLN
INTRAMUSCULAR | Status: AC
  Administered 2024-05-13: 50 ug via INTRAVENOUS
  Filled 2024-05-13: qty 2

## 2024-05-13 MED ORDER — TRAMADOL HCL 50 MG PO TABS: 50.0000 mg | ORAL_TABLET | Freq: Four times a day (QID) | ORAL | 0 refills | Status: AC | PRN

## 2024-05-13 MED ORDER — OXYCODONE HCL 5 MG PO TABS: 5.0000 mg | ORAL_TABLET | Freq: Once | ORAL | Status: DC | PRN

## 2024-05-13 MED ORDER — DEXMEDETOMIDINE HCL IN NACL 80 MCG/20ML IV SOLN
INTRAVENOUS | Status: DC | PRN
  Administered 2024-05-13: 8 ug via INTRAVENOUS
  Administered 2024-05-13: 4 ug via INTRAVENOUS

## 2024-05-13 MED ORDER — ACETAMINOPHEN 500 MG PO TABS
ORAL_TABLET | ORAL | Status: AC
  Administered 2024-05-13: 1000 mg via ORAL
  Filled 2024-05-13: qty 2

## 2024-05-13 SURGICAL SUPPLY — 34 items
BAG COUNTER SPONGE SURGICOUNT (BAG) ×1 IMPLANT
BINDER BREAST LRG (GAUZE/BANDAGES/DRESSINGS) IMPLANT
BINDER BREAST XLRG (GAUZE/BANDAGES/DRESSINGS) IMPLANT
CANISTER SUCTION 3000ML PPV (SUCTIONS) ×1 IMPLANT
CHLORAPREP W/TINT 26 (MISCELLANEOUS) ×1 IMPLANT
CLIP APPLIE 9.375 MED OPEN (MISCELLANEOUS) ×1 IMPLANT
CNTNR URN SCR LID CUP LEK RST (MISCELLANEOUS) IMPLANT
COVER PROBE W GEL 5X96 (DRAPES) ×2 IMPLANT
COVER SURGICAL LIGHT HANDLE (MISCELLANEOUS) ×1 IMPLANT
DERMABOND ADVANCED .7 DNX12 (GAUZE/BANDAGES/DRESSINGS) ×1 IMPLANT
DEVICE DUBIN SPECIMEN MAMMOGRA (MISCELLANEOUS) ×1 IMPLANT
DRAPE CHEST BREAST 15X10 FENES (DRAPES) ×1 IMPLANT
ELECT CAUTERY BLADE 6.4 (BLADE) ×1 IMPLANT
ELECTRODE REM PT RTRN 9FT ADLT (ELECTROSURGICAL) ×1 IMPLANT
GLOVE SURG SIGNA 7.5 PF LTX (GLOVE) ×1 IMPLANT
GOWN STRL REUS W/ TWL LRG LVL3 (GOWN DISPOSABLE) ×1 IMPLANT
GOWN STRL REUS W/ TWL XL LVL3 (GOWN DISPOSABLE) ×1 IMPLANT
KIT BASIN OR (CUSTOM PROCEDURE TRAY) ×1 IMPLANT
KIT MARKER MARGIN INK (KITS) ×1 IMPLANT
NDL 18GX1X1/2 (RX/OR ONLY) (NEEDLE) IMPLANT
NDL FILTER BLUNT 18X1 1/2 (NEEDLE) IMPLANT
NDL HYPO 25GX1X1/2 BEV (NEEDLE) ×1 IMPLANT
NEEDLE 18GX1X1/2 (RX/OR ONLY) (NEEDLE) IMPLANT
NEEDLE FILTER BLUNT 18X1 1/2 (NEEDLE) ×1 IMPLANT
NEEDLE HYPO 25GX1X1/2 BEV (NEEDLE) ×2 IMPLANT
NS IRRIG 1000ML POUR BTL (IV SOLUTION) ×1 IMPLANT
PACK GENERAL/GYN (CUSTOM PROCEDURE TRAY) ×1 IMPLANT
PAD ABD 8X10 STRL (GAUZE/BANDAGES/DRESSINGS) IMPLANT
SUT MNCRL AB 4-0 PS2 18 (SUTURE) ×1 IMPLANT
SUT VIC AB 3-0 SH 18 (SUTURE) ×1 IMPLANT
SYR 3ML LL SCALE MARK (SYRINGE) IMPLANT
SYR CONTROL 10ML LL (SYRINGE) ×1 IMPLANT
TOWEL GREEN STERILE (TOWEL DISPOSABLE) ×1 IMPLANT
TOWEL GREEN STERILE FF (TOWEL DISPOSABLE) ×1 IMPLANT

## 2024-05-13 NOTE — Transfer of Care (Signed)
 Immediate Anesthesia Transfer of Care Note  Patient: Kristina [name]  Procedure(s) Performed: BREAST LUMPECTOMY WITH RADIOACTIVE SEED AND SENTINEL LYMPH NODE BIOPSY (Left: Breast)  Patient Location: PACU  Anesthesia Type:General  Level of Consciousness: awake, alert , and oriented  Airway & Oxygen Therapy: Patient Spontanous Breathing  Post-op Assessment: Report given to RN and Post -op Vital signs reviewed and stable  Post vital signs: Reviewed and stable  Last Vitals:  Vitals Value Taken Time  BP 141/93 1554  Temp    Pulse 90 05/13/24 15:54  Resp 20 05/13/24 15:54  SpO2 92 % 05/13/24 15:54  Vitals shown include unfiled device data.  Last Pain:  Vitals:   05/13/24 1335  TempSrc:   PainSc: 0-No pain         Complications: There were no known notable events for this encounter.

## 2024-05-13 NOTE — Anesthesia Procedure Notes (Signed)
 Anesthesia Regional Block: Pectoralis block   Pre-Anesthetic Checklist: , timeout performed,  Correct Patient, Correct Site, Correct Laterality,  Correct Procedure, Correct Position, site marked,  Risks and benefits discussed,  Surgical consent,  Pre-op evaluation,  At surgeon's request and post-op pain management  Laterality: Left  Prep: chloraprep       Needles:  Injection technique: Single-shot  Needle Type: Echogenic Stimulator Needle     Needle Length: 10cm  Needle Gauge: 21   Needle insertion depth: 8 cm   Additional Needles:   Procedures:,,,, ultrasound used (permanent image in chart),,    Narrative:  Start time: 05/13/2024 1:35 PM End time: 05/13/2024 1:40 PM Injection made incrementally with aspirations every 5 mL.  Performed by: Personally  Anesthesiologist: Jerrye Sharper, MD  Additional Notes: Timeout performed. Patient sedated. Relevant anatomy ID'd using US . Incremental 2-5ml injection of LA with frequent aspiration. Patient tolerated procedure well.

## 2024-05-13 NOTE — Anesthesia Preprocedure Evaluation (Signed)
 Anesthesia Evaluation  Patient identified by MRN, date of birth, ID band Patient awake    Reviewed: Allergy & Precautions, NPO status , Patient's Chart, lab work & pertinent test results  History of Anesthesia Complications (+) PONV and history of anesthetic complications  Airway Mallampati: II       Dental no notable dental hx. (+) Teeth Intact, Dental Advisory Given   Pulmonary asthma , pneumonia, resolved, former smoker   Pulmonary exam normal breath sounds clear to auscultation       Cardiovascular negative cardio ROS Normal cardiovascular exam Rhythm:Regular Rate:Normal     Neuro/Psych  PSYCHIATRIC DISORDERS Anxiety Depression    negative neurological ROS     GI/Hepatic Neg liver ROS,GERD  Medicated,,  Endo/Other  Left Breast Ca  Renal/GU negative Renal ROS  negative genitourinary   Musculoskeletal negative musculoskeletal ROS (+)    Abdominal   Peds  Hematology negative hematology ROS (+)   Anesthesia Other Findings   Reproductive/Obstetrics                              Anesthesia Physical Anesthesia Plan  ASA: 2  Anesthesia Plan: General   Post-op Pain Management: Regional block*, Minimal or no pain anticipated, Precedex , Dilaudid  IV and Ofirmev  IV (intra-op)*   Induction: Intravenous  PONV Risk Score and Plan: 4 or greater and Treatment may vary due to age or medical condition, Midazolam , Ondansetron , Scopolamine  patch - Pre-op, Dexamethasone , Propofol  infusion and TIVA  Airway Management Planned: LMA  Additional Equipment: None  Intra-op Plan:   Post-operative Plan: Extubation in OR  Informed Consent: I have reviewed the patients History and Physical, chart, labs and discussed the procedure including the risks, benefits and alternatives for the proposed anesthesia with the patient or authorized representative who has indicated his/her understanding and acceptance.      Dental advisory given  Plan Discussed with: CRNA and Anesthesiologist  Anesthesia Plan Comments:          Anesthesia Quick Evaluation

## 2024-05-13 NOTE — Op Note (Signed)
   Kristina [name] 05/13/2024   Pre-op Diagnosis: LEFT BREAST CANCER     Post-op Diagnosis: same  Procedure(s): RADIOACTIVE SEED GUIDED LEFT BREAST LUMPECTOMY DEEP LEFT AXILLARY LYMPH NODE BIOPSY INJECTION OF MAG TRACE FOR LYMPH NODE MAPPING  Surgeon(s): [name] Berg, MD  Anesthesia: General  Staff:  Circulator: Welford Stevenson NOVAK, RN Scrub Person: Mercer Lilyan FALCON, CST Circulator Assistant: Deette Thedora LABOR, RN  Estimated Blood Loss: Minimal               Specimens: sent to path  Indications: This is a 53 year old female is found to have a mass in the left breast on screening mammography.  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  The decision was made to proceed with a radioactive seed guided left breast lumpectomy and sentinel lymph node biopsy  Procedure: The patient was brought to the operating room and identified the correct patient.  She was placed upon the operating table and general anesthesia was induced.  I next injected mag trace under sterile technique underneath the left nipple areolar complex and massaged the breast.  Her left breast and axilla were then prepped and draped in usual sterile fashion.  Using the neoprobe identified the radioactive seed in the upper outer quadrant of the left breast.  This was 7 cm from the nipple areolar complex.  I elected to anesthetize skin over this area and performed a transverse incision in the upper outer quadrant of the breast.  I then dissected down to the breast tissue with electrocautery.  With the aid of the neoprobe I then attempted to stay widely around the signal from the radioactive seed dissecting medial to the seed and then superior and lateral and then inferior.  I dissected circumferentially down to the chest wall and then completed the lumpectomy removing the specimen.  I marked all margins with paint.  An x-ray was performed on the specimen confirming that the radioactive seed and previous biopsy clip were  in the specimen.  The specimen was sent to pathology for evaluation.  I next anesthetized the skin in the left axilla with Marcaine  and made incision with a scalpel.  I then dissected down to the deep left axillary tissue.  With the mag trace probe I was able to identify at least 2 sentinel lymph nodes which I excised using the cautery removing the surrounding fat as well.  These nodes were sent to pathology for evaluation.  I reevaluated the axilla with the matrix probe and cell no other increased uptake and there were no large palpable lymph nodes identified.  At this point hemostasis to be achieved in both incisions.  I anesthetized the lumpectomy site further with Marcaine  in place surgical clips for marking purposes.  I then closed both incisions with interrupted 3-0 Vicryl sutures and running 4-0 Monocryl sutures.  Dermabond was then applied.  The patient tolerated the procedure well.  All the counts were correct at the end of the procedure.  The patient was then extubated in the operating room and taken in stable condition to the recovery room.  She was placed in the breast binder prior to leaving.          Berg [name]   Date: 05/13/2024  Time: 3:46 PM

## 2024-05-13 NOTE — Anesthesia Postprocedure Evaluation (Signed)
 Anesthesia Post Note  Patient: Kristina [name]  Procedure(s) Performed: BREAST LUMPECTOMY WITH RADIOACTIVE SEED AND SENTINEL LYMPH NODE BIOPSY (Left: Breast)     Patient location during evaluation: PACU Anesthesia Type: General Level of consciousness: awake and alert and oriented Pain management: pain level controlled Vital Signs Assessment: post-procedure vital signs reviewed and stable Respiratory status: spontaneous breathing, nonlabored ventilation and respiratory function stable Cardiovascular status: blood pressure returned to baseline and stable Postop Assessment: no apparent nausea or vomiting Anesthetic complications: no   There were no known notable events for this encounter.  Last Vitals:  Vitals:   05/13/24 1615 05/13/24 1630  BP: (!) 141/95 (!) 151/80  Pulse: 71 60  Resp: 16 16  Temp:  (!) 36.3 C  SpO2: 94% 94%    Last Pain:  Vitals:   05/13/24 1630  TempSrc:   PainSc: 0-No pain   Pain Goal:                   Garland Smouse A.

## 2024-05-13 NOTE — Anesthesia Procedure Notes (Signed)
 Procedure Name: LMA Insertion Date/Time: 05/13/2024 2:40 PM  Performed by: Christopher Comings, CRNAPre-anesthesia Checklist: Patient identified, Emergency Drugs available, Suction available and Patient being monitored Patient Re-evaluated:Patient Re-evaluated prior to induction Oxygen Delivery Method: Circle system utilized Preoxygenation: Pre-oxygenation with 100% oxygen Induction Type: IV induction Ventilation: Mask ventilation without difficulty LMA: LMA inserted LMA Size: 4.0 Number of attempts: 1 Placement Confirmation: positive ETCO2 and breath sounds checked- equal and bilateral Tube secured with: Tape Dental Injury: Teeth and Oropharynx as per pre-operative assessment

## 2024-05-13 NOTE — Discharge Instructions (Signed)
[facility] Office Phone Number [phone]  BREAST BIOPSY/ PARTIAL MASTECTOMY: POST OP INSTRUCTIONS  Always review your discharge instruction sheet given to you by the facility where your surgery was performed.  IF YOU HAVE DISABILITY OR FAMILY LEAVE FORMS, YOU MUST BRING THEM TO THE OFFICE FOR PROCESSING.  DO NOT GIVE THEM TO YOUR DOCTOR.  A prescription for pain medication may be given to you upon discharge.  Take your pain medication as prescribed, if needed.  If narcotic pain medicine is not needed, then you may take acetaminophen (Tylenol) or ibuprofen (Advil) as needed. Take your usually prescribed medications unless otherwise directed If you need a refill on your pain medication, please contact your pharmacy.  They will contact our office to request authorization.  Prescriptions will not be filled after 5pm or on week-ends. You should eat very light the first 24 hours after surgery, such as soup, crackers, pudding, etc.  Resume your normal diet the day after surgery. Most patients will experience some swelling and bruising in the breast.  Ice packs and a good support bra will help.  Swelling and bruising can take several days to resolve.  It is common to experience some constipation if taking pain medication after surgery.  Increasing fluid intake and taking a stool softener will usually help or prevent this problem from occurring.  A mild laxative (Milk of Magnesia or Miralax) should be taken according to package directions if there are no bowel movements after 48 hours. Unless discharge instructions indicate otherwise, you may remove your bandages 24-48 hours after surgery, and you may shower at that time.  You may have steri-strips (small skin tapes) in place directly over the incision.  These strips should be left on the skin for 7-10 days.  If your surgeon used skin glue on the incision, you may shower in 24 hours.  The glue will flake off over the next 2-3 weeks.  Any  sutures or staples will be removed at the office during your follow-up visit. ACTIVITIES:  You may resume regular daily activities (gradually increasing) beginning the next day.  Wearing a good support bra or sports bra minimizes pain and swelling.  You may have sexual intercourse when it is comfortable. You may drive when you no longer are taking prescription pain medication, you can comfortably wear a seatbelt, and you can safely maneuver your car and apply brakes. RETURN TO WORK:  ______________________________________________________________________________________ Kristina [name] should see your doctor in the office for a follow-up appointment approximately two weeks after your surgery.  Your doctor's nurse will typically make your follow-up appointment when she calls you with your pathology report.  Expect your pathology report 2-3 business days after your surgery.  You may call to check if you do not hear from [place] after three days. OTHER INSTRUCTIONS: _YOU MAY REMOVE THE BINDER AND SHOWER STARTING TOMORROW AND THEN PUT THE BINDER BACK ON  ICE PACK, TYLENOL, AND IBUPROFEN ALSO FOR PAIN NO VIGOROUS ACTIVITY FOR ONE WEEK ______________________________________________________________________________________________ _____________________________________________________________________________________________________________________________________ _____________________________________________________________________________________________________________________________________ _____________________________________________________________________________________________________________________________________  WHEN TO CALL YOUR DOCTOR: Fever over 101.0 Nausea and/or vomiting. Extreme swelling or bruising. Continued bleeding from incision. Increased pain, redness, or drainage from the incision.  The clinic staff is available to answer your questions during regular business hours.  Please don't hesitate to  call and ask to speak to one of the nurses for clinical concerns.  If you have a medical emergency, go to the nearest emergency room or call 911.  A surgeon from [facility] is  always on call at the hospital.  For further questions, please visit [email or web address]

## 2024-05-13 NOTE — Interval H&P Note (Signed)
 History and Physical Interval Note: no change in H and P  05/13/2024 2:25 PM  Kristina [name]  has presented today for surgery, with the diagnosis of LEFT BREAST CANCER.  The various methods of treatment have been discussed with the patient and family. After consideration of risks, benefits and other options for treatment, the patient has consented to  Procedure(s) with comments: BREAST LUMPECTOMY WITH RADIOACTIVE SEED AND SENTINEL LYMPH NODE BIOPSY (Left) - RADIOACTIVE SEED GUIDED LEFT BREAST LUMPECTOMY SENTINEL NODE BIOPSY as a surgical intervention.  The patient's history has been reviewed, patient examined, no change in status, stable for surgery.  I have reviewed the patient's chart and labs.  Questions were answered to the patient's satisfaction.     Vicenta Poli

## 2024-05-13 NOTE — Progress Notes (Signed)
 HPI:   Kristina [name] was previously seen in the [facility] due to a personal and family history of cancer and concerns regarding a hereditary predisposition to cancer. Please refer to our prior cancer genetics clinic note for more information regarding our discussion, assessment and recommendations, at the time. Kristina [name] recent genetic test results were disclosed to Kristina, as were recommendations warranted by these results. These results and recommendations are discussed in more detail below.  CANCER HISTORY:  Oncology History  Malignant neoplasm of upper-outer quadrant of left breast in female, estrogen receptor positive (HCC)  05/03/2024 Initial Diagnosis   Malignant neoplasm of upper-outer quadrant of left breast in female, estrogen receptor positive (HCC)   05/05/2024 Cancer Staging   Staging form: Breast, AJCC 8th Edition - Clinical stage from 05/05/2024: Stage IA (cT1c, cN0, cM0, G2, ER+, PR+, HER2-) - Signed by Loretha Ash, MD on 05/05/2024 Stage prefix: Initial diagnosis Histologic grading system: 3 grade system Laterality: Left Staged by: Pathologist and managing physician Stage used in treatment planning: Yes National guidelines used in treatment planning: Yes Type of national guideline used in treatment planning: NCCN    Genetic Testing   [facility] CancerNext-Expanded Panel+RNA was Negative. 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 Report date is 05/11/2024.   The CancerNext-Expanded gene panel offered by [facility] and includes sequencing, rearrangement, and RNA analysis for the following 77 genes: AIP, ALK, APC, ATM, AXIN2, BAP1, BARD1, BMPR1A, BRCA1, BRCA2, BRIP1, CDC73, CDH1, CDK4, CDKN1B, CDKN2A, CEBPA, CHEK2, CTNNA1, DDX41, DICER1, ETV6, FH, FLCN, GATA2, LZTR1, MAX, MBD4, MEN1, MET, MLH1, MSH2, MSH3, MSH6, MUTYH, NF1, NF2, NTHL1, PALB2, PHOX2B, PMS2, POT1, PRKAR1A, PTCH1, PTEN, RAD51C, RAD51D, RB1, RET, RPS20,  RUNX1, SDHA, SDHAF2, SDHB, SDHC, SDHD, SMAD4, SMARCA4, SMARCB1, SMARCE1, STK11, SUFU, TMEM127, TP53, TSC1, TSC2, VHL, and WT1 (sequencing and deletion/duplication); EGFR, HOXB13, KIT, MITF, PDGFRA, POLD1, and POLE (sequencing only); EPCAM and GREM1 (deletion/duplication only).       FAMILY HISTORY:  We obtained a detailed, 4-generation family history.  Significant diagnoses are listed below:      Family History  Problem Relation Age of Onset   Cancer Maternal Cousin 61 - 20        unknown type       Kristina [name] is unaware of previous family history of genetic testing for hereditary cancer risks. There is no reported Ashkenazi Jewish ancestry.   GENETIC TEST RESULTS:  The [facility] CancerNext-Expanded Panel found no pathogenic mutations.  The CancerNext-Expanded gene panel offered by [facility] and includes sequencing, rearrangement, and RNA analysis for the following 77 genes: AIP, ALK, APC, ATM, AXIN2, BAP1, BARD1, BMPR1A, BRCA1, BRCA2, BRIP1, CDC73, CDH1, CDK4, CDKN1B, CDKN2A, CEBPA, CHEK2, CTNNA1, DDX41, DICER1, ETV6, FH, FLCN, GATA2, LZTR1, MAX, MBD4, MEN1, MET, MLH1, MSH2, MSH3, MSH6, MUTYH, NF1, NF2, NTHL1, PALB2, PHOX2B, PMS2, POT1, PRKAR1A, PTCH1, PTEN, RAD51C, RAD51D, RB1, RET, RPS20, RUNX1, SDHA, SDHAF2, SDHB, SDHC, SDHD, SMAD4, SMARCA4, SMARCB1, SMARCE1, STK11, SUFU, TMEM127, TP53, TSC1, TSC2, VHL, and WT1 (sequencing and deletion/duplication); EGFR, HOXB13, KIT, MITF, PDGFRA, POLD1, and POLE (sequencing only); EPCAM and GREM1 (deletion/duplication only).    The test report has been scanned into EPIC and is located under the Molecular Pathology section of the Results Review tab.  A portion of the result report is included below for reference. Genetic testing reported out on 05/11/2024.       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  At this time, it is unknown if this variant is associated with an increased risk for  cancer or if it is benign, but most uncertain variants are reclassified to benign. It should not be used to make medical management decisions. With time, we suspect the laboratory will determine the significance of this variant, if any. If the laboratory reclassifies this variant, we will attempt to contact Kristina [name] to discuss it further.   Even though a pathogenic variant was not identified, possible explanations for Kristina personal history of cancer may include: There may be no hereditary risk for cancer in the family. Kristina personal history of cancer may be due to other genetic or environmental factors. There may be a gene mutation in one of these genes that current testing methods cannot detect, but that chance is small. There could be another gene that has not yet been discovered, or that we have not yet tested, that is responsible for Kristina personal history of cancer.   Therefore, it is important to remain in touch with cancer genetics in the future so that we can continue to offer Kristina [name] the most up to date genetic testing.  ADDITIONAL GENETIC TESTING:  We discussed with Kristina [name] that Kristina genetic testing was fairly extensive.  If there are genes identified to increase cancer risk that can be analyzed in the future, we would be happy to discuss and coordinate this testing at that time.    CANCER SCREENING RECOMMENDATIONS:  Kristina [name] test result is considered negative (normal).  This means that we have not identified a hereditary cause for Kristina personal and family history of cancer at this time.  An individual's cancer risk and medical management are not determined by genetic test results alone. Overall cancer risk assessment incorporates additional factors, including personal medical history, family history, and any available genetic information that may result in a personalized plan for cancer prevention and surveillance. Therefore, it is recommended she continue to follow the cancer management  and screening guidelines provided by Kristina oncology and primary healthcare provider.  RECOMMENDATIONS FOR FAMILY MEMBERS:   Since she did not inherit a mutation in a cancer predisposition gene included on this panel, Kristina [name] could not have inherited a mutation from Kristina in one of these genes. Individuals in this family might be at some increased risk of developing cancer, over the general population risk, due to the family history of cancer. We recommend women in this family have a yearly mammogram beginning at age 63, or 7 years younger than the earliest onset of cancer, an annual clinical breast exam, and perform monthly breast self-exams. We do not recommend familial testing for the POLE variant of uncertain significance (VUS).  FOLLOW-UP:  Cancer genetics is a rapidly advancing field and it is possible that new genetic tests will be appropriate for Kristina and/or Kristina family members in the future. We encouraged Kristina to remain in contact with cancer genetics on an annual basis so we can update Kristina personal and family histories and let Kristina know of advances in cancer genetics that may benefit this family.   Our contact number was provided. Kristina [name] questions were answered to Kristina satisfaction, and she knows she is welcome to call us  at anytime with additional questions or concerns.   Kristina Paradiso, MS, [facility] Genetic Counselor [email or web address] (P) [phone]

## 2024-05-14 ENCOUNTER — Telehealth: Payer: Self-pay | Admitting: *Deleted

## 2024-05-14 ENCOUNTER — Encounter (HOSPITAL_COMMUNITY): Payer: Self-pay | Admitting: Surgery

## 2024-05-14 ENCOUNTER — Encounter: Payer: Self-pay | Admitting: *Deleted

## 2024-05-14 NOTE — Telephone Encounter (Signed)
 Spoke with patient to follow up from [facility] and assess navigation needs.  Patient denies any questions or concerns at this time. She stated her sx went well yesterday and she is doing great.  Encouraged her to call should anything arise. Patient verbalized understanding.

## 2024-05-17 ENCOUNTER — Telehealth: Payer: Self-pay | Admitting: Hematology and Oncology

## 2024-05-17 LAB — SURGICAL PATHOLOGY

## 2024-05-17 NOTE — Telephone Encounter (Signed)
 Kristina [name] has been re-scheduled and she is aware of her appointment details.

## 2024-05-21 ENCOUNTER — Telehealth: Payer: Self-pay | Admitting: *Deleted

## 2024-05-21 ENCOUNTER — Encounter: Payer: Self-pay | Admitting: *Deleted

## 2024-05-21 NOTE — Telephone Encounter (Signed)
Ordered oncotype per Dr. Iruku. Sent requisition to pathology and exact sciences. 

## 2024-06-02 NOTE — Therapy (Signed)
 OUTPATIENT PHYSICAL THERAPY BREAST CANCER POST OP FOLLOW UP   Patient Name: Kristina [name] MRN: [id number] DOB:[birth year], 53 y.o., female Today's Date: 06/03/2024  END OF SESSION:  PT End of Session - 06/03/24 1018     Visit Number 2    Number of Visits 2    Date for PT Re-Evaluation 06/10/24    PT Start Time 1000    PT Stop Time 1018    PT Time Calculation (min) 18 min    Activity Tolerance Patient tolerated treatment well    Behavior During Therapy WFL for tasks assessed/performed          Past Medical History:  Diagnosis Date   Anxiety    Asthma    Complication of anesthesia    Depression    PONV (postoperative nausea and vomiting)    Thyroid disease    Past Surgical History:  Procedure Laterality Date   BREAST BIOPSY Left 04/27/2024   US  LT BREAST BX W LOC DEV 1ST LESION IMG BX SPEC US  GUIDE 04/27/2024 [facility]MO[facility]   BREAST BIOPSY Left 05/11/2024   US  LT RADIOACTIVE SEED LOC 05/11/2024 [facility]   BREAST LUMPECTOMY WITH RADIOACTIVE SEED AND SENTINEL LYMPH NODE BIOPSY Left 05/13/2024   Procedure: BREAST LUMPECTOMY WITH RADIOACTIVE SEED AND SENTINEL LYMPH NODE BIOPSY;  Surgeon: [name] Berg, MD;  Location: [facility] OR;  Service: General;  Laterality: Left;  RADIOACTIVE SEED GUIDED LEFT BREAST LUMPECTOMY   CHOLECYSTECTOMY  2009   ECTOPIC PREGNANCY SURGERY     Patient Active Problem List   Diagnosis Date Noted   Genetic testing 05/11/2024   Malignant neoplasm of upper-outer quadrant of left breast in female, estrogen receptor positive (HCC) 05/03/2024   Pneumonia due to COVID-19 virus 09/08/2019   Acute respiratory failure with hypoxia (HCC) 09/08/2019   Hypokalemia 09/08/2019   Moderate persistent asthma 06/22/2015   Other allergic rhinitis 06/22/2015   Laryngopharyngeal reflux (LPR) 06/22/2015    PCP: Aleck Milch, MD  REFERRING PROVIDER: Berg [name], MD  REFERRING DIAG:  Diagnosis  C50.412,Z17.0 (ICD-10-CM) - Malignant neoplasm of  upper-outer quadrant of left breast in female, estrogen receptor positive (HCC)   THERAPY DIAG:  Malignant neoplasm of upper-outer quadrant of left breast in female, estrogen receptor positive (HCC)  Aftercare following surgery for neoplasm  At risk for lymphedema  Rationale for Evaluation and Treatment: Rehabilitation  ONSET DATE: 05/03/24  SUBJECTIVE:                                                                                                                                                                                           SUBJECTIVE STATEMENT: I am  doing really well.  A little glue on my axillary incision.  Not wearing compression this week.    PERTINENT HISTORY:  Patient was diagnosed with left grade 2 invasive mammary carcinoma. It measures 1.5 cm and is located in the upper outer quadrant. It is ER/PR positive and HER2 negative with a Ki67 of 5%. lumpectomy on 05/13/24 with SLNB. Pt had 7 negative sentinel nodes removed and 2 mammary nodes removed which were also negative. Will have radiation.    PATIENT GOALS:  Reassess how my recovery is going related to arm function, pain, and swelling.  PAIN:  Are you having pain? No just numbness in the armpit   PRECAUTIONS: Recent Surgery, left UE Lymphedema risk  RED FLAGS: None   ACTIVITY LEVEL / LEISURE: back to normal activity. 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     OBJECTIVE:   PATIENT SURVEYS:  QUICK DASH: 0%   OBSERVATIONS: Well healed incisions, no cording evident in axilla   POSTURE:  Rounded shoulders   LYMPHEDEMA ASSESSMENT:   UPPER EXTREMITY AROM/PROM:   A/PROM RIGHT   eval    Shoulder extension 70  Shoulder flexion 165  Shoulder abduction 165  Shoulder internal rotation 70  Shoulder external rotation 100                          (Blank rows = not tested)   A/PROM LEFT   eval 06/03/24  Shoulder extension 60 60  Shoulder flexion 160 160  Shoulder  abduction 165 163  Shoulder internal rotation 80 78  Shoulder external rotation 90 89                          (Blank rows = not tested)   CERVICAL AROM: All within normal limits:    UPPER EXTREMITY STRENGTH: all 5/5 without pain     LYMPHEDEMA ASSESSMENTS (in cm):    LANDMARK RIGHT   eval  10 cm proximal to olecranon process 30.8  Olecranon process 26.5  10 cm proximal to ulnar styloid process 21  Just proximal to ulnar styloid process 16.5  Across hand at thumb web space 19.8  At base of 2nd digit 6.4  (Blank rows = not tested)   LANDMARK LEFT   eval 06/03/24  10 cm proximal to olecranon process 30.6 30.4  Olecranon process 26.5 26.5  10 cm proximal to ulnar styloid process 22.1 22.1  Just proximal to ulnar styloid process 16.3 16.3  Across hand at thumb web space 18.9 18.9  At base of 2nd digit 6.1 6.1  (Blank rows = not tested)  PATIENT EDUCATION:  Education details: per post op instruction section below  Person educated: Patient Education method: Explanation and Handouts Education comprehension: verbalized understanding  HOME EXERCISE PROGRAM: Reviewed previously given post op HEP.   ASSESSMENT:  CLINICAL IMPRESSION: Pt is doing very well after lumpectomy and SLNB.  She has returned to baseline ROM and is reporting no limitations with her QDASH at 0%.  She is also having no pain.  Pt will return for SOZO screens now.   Pt will benefit from skilled therapeutic intervention to improve on the following deficits: Decreased knowledge of precautions, impaired UE functional use, pain, decreased ROM, postural dysfunction.   PT treatment/interventions: ADL/Self care home management, [phone]- PT Re-evaluation   GOALS: Goals reviewed with patient? Yes  LONG TERM GOALS:  (  STG=LTG)  GOALS Name Target Date  Goal status  1 Pt will demonstrate she has regained full shoulder ROM and function post operatively compared to baselines.  Baseline: 06/03/24 INITIAL                     PLAN:  PT FREQUENCY/DURATION: SOZO only  PLAN FOR NEXT SESSION: SOZO only    [facility]  [address], [address]  [place] [address]  [phone]  After Breast Cancer Class Video It is recommended you view the ABC class video to be educated on lymphedema risk reduction. This video lasts for about 30 minutes. It can be viewed on our website here: [email or web address]  Scar massage You can begin gentle scar massage to you incision sites. Gently place one hand on the incision and move the skin (without sliding on the skin) in various directions. Do this for a few minutes and then you can gently massage either coconut oil or vitamin E cream into the scars.  Compression garment You should continue wearing your compression bra until you feel like you no longer have swelling.  Home exercise Program Continue doing the exercises you were given until you feel like you can do them without feeling any tightness at the end.   Walking Program Studies show that 30 minutes of walking per day (fast enough to elevate your heart rate) can significantly reduce the risk of a cancer recurrence. If you can't walk due to other medical reasons, we encourage you to find another activity you could do (like a stationary bike or water exercise).  Posture After breast cancer surgery, people frequently sit with rounded shoulders posture because it puts their incisions on slack and feels better. If you sit like this and scar tissue forms in that position, you can become very tight and have pain sitting or standing with good posture. Try to be aware of your posture and sit and stand up tall to heal properly.  Follow up PT: It is recommended you return every 3 months for the first 3 years following surgery to be assessed on the SOZO machine for an L-Dex score. This helps prevent clinically significant lymphedema in  95% of patients. These follow up screens are 10 minute appointments that you are not billed for.  Larue Saddie SAUNDERS, PT 06/03/2024, 10:21 AM  PHYSICAL THERAPY DISCHARGE SUMMARY  Visits from Start of Care: 2  Current functional level related to goals / functional outcomes: All goals met   Remaining deficits: Lymphedema risk    Education / Equipment: Final self care  Plan: Patient agrees to discharge.  Patient is being discharged due to meeting the stated rehab goals.

## 2024-06-03 ENCOUNTER — Telehealth: Payer: Self-pay | Admitting: *Deleted

## 2024-06-03 ENCOUNTER — Encounter: Payer: Self-pay | Admitting: *Deleted

## 2024-06-03 ENCOUNTER — Ambulatory Visit: Payer: Self-pay | Attending: Surgery | Admitting: Rehabilitation

## 2024-06-03 ENCOUNTER — Encounter: Payer: Self-pay | Admitting: Rehabilitation

## 2024-06-03 ENCOUNTER — Encounter (HOSPITAL_COMMUNITY): Payer: Self-pay

## 2024-06-03 DIAGNOSIS — Z170 Estrogen receptor positive status [ER+]: Secondary | ICD-10-CM | POA: Insufficient documentation

## 2024-06-03 DIAGNOSIS — Z483 Aftercare following surgery for neoplasm: Secondary | ICD-10-CM | POA: Insufficient documentation

## 2024-06-03 DIAGNOSIS — Z9189 Other specified personal risk factors, not elsewhere classified: Secondary | ICD-10-CM | POA: Diagnosis present

## 2024-06-03 DIAGNOSIS — C50412 Malignant neoplasm of upper-outer quadrant of left female breast: Secondary | ICD-10-CM | POA: Diagnosis present

## 2024-06-03 NOTE — Telephone Encounter (Signed)
Received oncotype results of 15/4%. Referral placed for Dr. Basilio Cairo

## 2024-06-07 ENCOUNTER — Ambulatory Visit: Admitting: Hematology and Oncology

## 2024-06-08 ENCOUNTER — Inpatient Hospital Stay: Attending: Hematology and Oncology | Admitting: Hematology and Oncology

## 2024-06-08 VITALS — BP 136/81 | HR 92 | Temp 97.9°F | Resp 14 | Wt 161.6 lb

## 2024-06-08 DIAGNOSIS — C50412 Malignant neoplasm of upper-outer quadrant of left female breast: Secondary | ICD-10-CM | POA: Diagnosis not present

## 2024-06-08 DIAGNOSIS — L259 Unspecified contact dermatitis, unspecified cause: Secondary | ICD-10-CM | POA: Insufficient documentation

## 2024-06-08 DIAGNOSIS — Z809 Family history of malignant neoplasm, unspecified: Secondary | ICD-10-CM | POA: Insufficient documentation

## 2024-06-08 DIAGNOSIS — Z87891 Personal history of nicotine dependence: Secondary | ICD-10-CM | POA: Insufficient documentation

## 2024-06-08 DIAGNOSIS — Z170 Estrogen receptor positive status [ER+]: Secondary | ICD-10-CM | POA: Diagnosis not present

## 2024-06-08 NOTE — Progress Notes (Signed)
 [facility] CONSULT NOTE  Patient Care Team: Jefferey Fitch, MD as PCP - General (Internal Medicine) Tyree Nanetta SAILOR, RN as Oncology Nurse Navigator Gerome, Devere HERO, RN as Oncology Nurse Navigator [name] Berg, MD as Consulting Physician (General Surgery) Loretha Ash, MD as Consulting Physician (Hematology and Oncology) Izell Domino, MD as Attending Physician (Radiation Oncology)  CHIEF COMPLAINTS/PURPOSE OF CONSULTATION:  Newly diagnosed breast cancer  HISTORY OF PRESENTING ILLNESS:  Kristina [name] 53 y.o. female is here because of recent diagnosis of left breast cancer  Discussed the use of AI scribe software for clinical note transcription with the patient, who gave verbal consent to proceed.  History of Present Illness  Kristina [name] is a 53 year old female with breast cancer who presents for follow-up after lumpectomy.  She underwent a lumpectomy for breast cancer, where a 1.5 cm mucinous tumor was excised. Pathology confirmed the tumor type and showed that two lymph nodes in the breast and seven additional lymph nodes were clear of cancer.  The breast cancer is estrogen and progesterone receptor positive, and the oncotype score was low. She is in the process of scheduling radiation therapy. She is currently menstruating approximately every three months, which influences the choice of hormonal therapy post-radiation.  She has resumed taking Singulair  and inquired about its interaction with tamoxifen, which was clarified as safe. She also takes magnesium and selenium for hypothyroidism, which she plans to pause during radiation therapy.  She experienced a rash after using deodorant for the first time post-surgery, which she attributes to possible irritation from the lymph node removal and healing process.  I reviewed her records extensively and collaborated the history with the patient.  SUMMARY OF ONCOLOGIC HISTORY: Oncology History  Malignant  neoplasm of upper-outer quadrant of left breast in female, estrogen receptor positive (HCC)  05/03/2024 Initial Diagnosis   Malignant neoplasm of upper-outer quadrant of left breast in female, estrogen receptor positive (HCC)   05/05/2024 Cancer Staging   Staging form: Breast, AJCC 8th Edition - Clinical stage from 05/05/2024: Stage IA (cT1c, cN0, cM0, G2, ER+, PR+, HER2-) - Signed by Loretha Ash, MD on 05/05/2024 Stage prefix: Initial diagnosis Histologic grading system: 3 grade system Laterality: Left Staged by: Pathologist and managing physician Stage used in treatment planning: Yes National guidelines used in treatment planning: Yes Type of national guideline used in treatment planning: NCCN    Genetic Testing   [facility] CancerNext-Expanded Panel+RNA was Negative. 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 Report date is 05/11/2024.   The CancerNext-Expanded gene panel offered by [facility] and includes sequencing, rearrangement, and RNA analysis for the following 77 genes: AIP, ALK, APC, ATM, AXIN2, BAP1, BARD1, BMPR1A, BRCA1, BRCA2, BRIP1, CDC73, CDH1, CDK4, CDKN1B, CDKN2A, CEBPA, CHEK2, CTNNA1, DDX41, DICER1, ETV6, FH, FLCN, GATA2, LZTR1, MAX, MBD4, MEN1, MET, MLH1, MSH2, MSH3, MSH6, MUTYH, NF1, NF2, NTHL1, PALB2, PHOX2B, PMS2, POT1, PRKAR1A, PTCH1, PTEN, RAD51C, RAD51D, RB1, RET, RPS20, RUNX1, SDHA, SDHAF2, SDHB, SDHC, SDHD, SMAD4, SMARCA4, SMARCB1, SMARCE1, STK11, SUFU, TMEM127, TP53, TSC1, TSC2, VHL, and WT1 (sequencing and deletion/duplication); EGFR, HOXB13, KIT, MITF, PDGFRA, POLD1, and POLE (sequencing only); EPCAM and GREM1 (deletion/duplication only).       MEDICAL HISTORY:  Past Medical History:  Diagnosis Date   Anxiety    Asthma    Complication of anesthesia    Depression    PONV (postoperative nausea and vomiting)    Thyroid disease     SURGICAL HISTORY: Past  Surgical History:  Procedure Laterality Date   BREAST  BIOPSY Left 04/27/2024   US  LT BREAST BX W LOC DEV 1ST LESION IMG BX SPEC US  GUIDE 04/27/2024 [facility]   BREAST BIOPSY Left 05/11/2024   US  LT RADIOACTIVE SEED LOC 05/11/2024 [facility]AMMOGR[facility]   BREAST LUMPECTOMY WITH RADIOACTIVE SEED AND SENTINEL LYMPH NODE BIOPSY Left 05/13/2024   Procedure: BREAST LUMPECTOMY WITH RADIOACTIVE SEED AND SENTINEL LYMPH NODE BIOPSY;  Surgeon: [name] Berg, MD;  Location: [facility] OR;  Service: General;  Laterality: Left;  RADIOACTIVE SEED GUIDED LEFT BREAST LUMPECTOMY   CHOLECYSTECTOMY  2009   ECTOPIC PREGNANCY SURGERY      SOCIAL HISTORY: Social History   Socioeconomic History   Marital status: Married    Spouse name: Not on file   Number of children: Not on file   Years of education: Not on file   Highest education level: Not on file  Occupational History   Not on file  Tobacco Use   Smoking status: Former   Smokeless tobacco: Never  Vaping Use   Vaping status: Never Used  Substance and Sexual Activity   Alcohol use: No   Drug use: No   Sexual activity: Not on file  Other Topics Concern   Not on file  Social History Narrative   Not on file   Social Drivers of Health   Financial Resource Strain: Not on file  Food Insecurity: No Food Insecurity (05/05/2024)   Hunger Vital Sign    Worried About Running Out of Food in the Last Year: Never true    Ran Out of Food in the Last Year: Never true  Transportation Needs: No Transportation Needs (05/05/2024)   PRAPARE - Administrator, Civil Service (Medical): No    Lack of Transportation (Non-Medical): No  Physical Activity: Not on file  Stress: Not on file  Social Connections: Not on file  Intimate Partner Violence: Not At Risk (05/05/2024)   Humiliation, Afraid, Rape, and Kick questionnaire    Fear of Current or Ex-Partner: No    Emotionally Abused: No    Physically Abused: No    Sexually Abused: No    FAMILY HISTORY: Family History  Problem Relation Age of Onset   Cancer  Maternal Cousin 67 - 20       unknown type   Breast cancer Neg Hx     ALLERGIES:  has no known allergies.  MEDICATIONS:  Current Outpatient Medications  Medication Sig Dispense Refill   Albuterol  Sulfate (PROAIR  RESPICLICK) 108 (90 Base) MCG/ACT AEPB Inhale 2 puffs into the lungs every 4 (four) hours as needed. (Patient taking differently: Inhale 2 puffs into the lungs every 4 (four) hours as needed (wheezing/shortness of breath.).) 1 each 0   Cyanocobalamin (VITAMIN B-12 PO) Take 1 tablet by mouth in the morning.     fexofenadine (ALLEGRA) 180 MG tablet Take 180 mg by mouth in the morning.     levothyroxine (SYNTHROID) 125 MCG tablet Take 125 mcg by mouth daily before breakfast.     traMADol  (ULTRAM ) 50 MG tablet Take 1 tablet (50 mg total) by mouth every 6 (six) hours as needed for moderate pain (pain score 4-6) or severe pain (pain score 7-10). 25 tablet 0   Vitamin D -Vitamin K (D3 + K2 PO) Take 1 tablet by mouth in the morning.     No current facility-administered medications for this visit.   All other systems were reviewed with the patient and are negative.  PHYSICAL EXAMINATION:  ECOG PERFORMANCE STATUS: 0 - Asymptomatic  Vitals:   06/08/24 1320  BP: 136/81  Pulse: 92  Resp: 14  Temp: 97.9 F (36.6 C)  SpO2: 97%   Filed Weights   06/08/24 1320  Weight: 161 lb 9.6 oz (73.3 kg)    GENERAL:alert, no distress and comfortable Left breast and axilla healing well.  LABORATORY DATA:  I have reviewed the data as listed Lab Results  Component Value Date   WBC 10.3 05/05/2024   HGB 14.7 05/05/2024   HCT 44.4 05/05/2024   MCV 80.0 05/05/2024   PLT 291 05/05/2024   Lab Results  Component Value Date   NA 139 05/05/2024   K 3.6 05/05/2024   CL 107 05/05/2024   CO2 25 05/05/2024    RADIOGRAPHIC STUDIES: I have personally reviewed the radiological reports and agreed with the findings in the report.  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 All lymph nodes were negative. Low oncotype score indicates no chemotherapy benefit. 4% risk of distant recurrence in nine years. - Coordinate with Dr. Izell for radiation therapy. - Initiate tamoxifen post-radiation for at least five years. - Educated on tamoxifen side effects: hot flashes, vaginal discharge, 1-2% risk of blood clots, endometrial hyperplasia and rarely endometrial carcinoma. She will RTC in approximately 8 weeks.  Contact dermatitis of axilla Rash likely due to deodorant irritation or surgical disinfectants. - Use special deodorant during radiation therapy.  Time spent: 30 min  All questions were answered. The patient knows to call the clinic with any problems, questions or concerns.    Amber Stalls, MD 06/08/24

## 2024-06-08 NOTE — Progress Notes (Signed)
 [facility] CONSULT NOTE  Patient Care Team: Jefferey Fitch, MD as PCP - General (Internal Medicine) Tyree Nanetta SAILOR, RN as Oncology Nurse Navigator Gerome, Devere HERO, RN as Oncology Nurse Navigator [name] Berg, MD as Consulting Physician (General Surgery) Loretha Ash, MD as Consulting Physician (Hematology and Oncology) Izell Domino, MD as Attending Physician (Radiation Oncology)  CHIEF COMPLAINTS/PURPOSE OF CONSULTATION:  Newly diagnosed breast cancer  HISTORY OF PRESENTING ILLNESS:  Kristina [name] 53 y.o. female is here because of recent diagnosis of left breast cancer  Discussed the use of AI scribe software for clinical note transcription with the patient, who gave verbal consent to proceed.  History of Present Illness Kristina [name] is a 53 year old female with estrogen and progesterone receptor-positive breast cancer who presents for a consultation regarding treatment options.  She was told she has a small, estrogen and progesterone receptor-positive breast cancer located at the two o'clock position in her breast. The lump, measuring 1.5 cm, was identified during imaging. She is not fully postmenopausal, having experienced a menstrual period last week, placing her in a 'gray zone' regarding menopause status.  She has been experiencing menopausal symptoms such as hot flashes, which she is managing. She inquires about non-hormonal options to help with these symptoms. She has noticed some weight loss recently, which she attributes to stress.  Her past medical history includes well-controlled asthma and allergies, and she is on thyroid medication. She has no significant family history of cancer. 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  She has been undergoing regular mammograms due to calcifications and was on a six-month follow-up schedule. Her recent mammogram, which was her first annual one, revealed  the current breast cancer. No history of blood clots.  I reviewed her records extensively and collaborated the history with the patient.  SUMMARY OF ONCOLOGIC HISTORY: Oncology History  Malignant neoplasm of upper-outer quadrant of left breast in female, estrogen receptor positive (HCC)  05/03/2024 Initial Diagnosis   Malignant neoplasm of upper-outer quadrant of left breast in female, estrogen receptor positive (HCC)   05/05/2024 Cancer Staging   Staging form: Breast, AJCC 8th Edition - Clinical stage from 05/05/2024: Stage IA (cT1c, cN0, cM0, G2, ER+, PR+, HER2-) - Signed by Loretha Ash, MD on 05/05/2024 Stage prefix: Initial diagnosis Histologic grading system: 3 grade system Laterality: Left Staged by: Pathologist and managing physician Stage used in treatment planning: Yes National guidelines used in treatment planning: Yes Type of national guideline used in treatment planning: NCCN    Genetic Testing   [facility] CancerNext-Expanded Panel+RNA was Negative. 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 Report date is 05/11/2024.   The CancerNext-Expanded gene panel offered by [facility] and includes sequencing, rearrangement, and RNA analysis for the following 77 genes: AIP, ALK, APC, ATM, AXIN2, BAP1, BARD1, BMPR1A, BRCA1, BRCA2, BRIP1, CDC73, CDH1, CDK4, CDKN1B, CDKN2A, CEBPA, CHEK2, CTNNA1, DDX41, DICER1, ETV6, FH, FLCN, GATA2, LZTR1, MAX, MBD4, MEN1, MET, MLH1, MSH2, MSH3, MSH6, MUTYH, NF1, NF2, NTHL1, PALB2, PHOX2B, PMS2, POT1, PRKAR1A, PTCH1, PTEN, RAD51C, RAD51D, RB1, RET, RPS20, RUNX1, SDHA, SDHAF2, SDHB, SDHC, SDHD, SMAD4, SMARCA4, SMARCB1, SMARCE1, STK11, SUFU, TMEM127, TP53, TSC1, TSC2, VHL, and WT1 (sequencing and deletion/duplication); EGFR, HOXB13, KIT, MITF, PDGFRA, POLD1, and POLE (sequencing only); EPCAM and GREM1 (deletion/duplication only).       MEDICAL HISTORY:  Past Medical History:  Diagnosis Date   Anxiety    Asthma  Complication of anesthesia    Depression    PONV (postoperative nausea and vomiting)    Thyroid disease     SURGICAL HISTORY: Past Surgical History:  Procedure Laterality Date   BREAST BIOPSY Left 04/27/2024   US  LT BREAST BX W LOC DEV 1ST LESION IMG BX SPEC US  GUIDE 04/27/2024 [facility]   BREAST BIOPSY Left 05/11/2024   US  LT RADIOACTIVE SEED LOC 05/11/2024 [facility]   BREAST LUMPECTOMY WITH RADIOACTIVE SEED AND SENTINEL LYMPH NODE BIOPSY Left 05/13/2024   Procedure: BREAST LUMPECTOMY WITH RADIOACTIVE SEED AND SENTINEL LYMPH NODE BIOPSY;  Surgeon: [name] Berg, MD;  Location: [facility] OR;  Service: General;  Laterality: Left;  RADIOACTIVE SEED GUIDED LEFT BREAST LUMPECTOMY   CHOLECYSTECTOMY  2009   ECTOPIC PREGNANCY SURGERY      SOCIAL HISTORY: Social History   Socioeconomic History   Marital status: Married    Spouse name: Not on file   Number of children: Not on file   Years of education: Not on file   Highest education level: Not on file  Occupational History   Not on file  Tobacco Use   Smoking status: Former   Smokeless tobacco: Never  Vaping Use   Vaping status: Never Used  Substance and Sexual Activity   Alcohol use: No   Drug use: No   Sexual activity: Not on file  Other Topics Concern   Not on file  Social History Narrative   Not on file   Social Drivers of Health   Financial Resource Strain: Not on file  Food Insecurity: No Food Insecurity (05/05/2024)   Hunger Vital Sign    Worried About Running Out of Food in the Last Year: Never true    Ran Out of Food in the Last Year: Never true  Transportation Needs: No Transportation Needs (05/05/2024)   PRAPARE - Administrator, Civil Service (Medical): No    Lack of Transportation (Non-Medical): No  Physical Activity: Not on file  Stress: Not on file  Social Connections: Not on file  Intimate Partner Violence: Not At Risk (05/05/2024)   Humiliation, Afraid, Rape, and Kick  questionnaire    Fear of Current or Ex-Partner: No    Emotionally Abused: No    Physically Abused: No    Sexually Abused: No    FAMILY HISTORY: Family History  Problem Relation Age of Onset   Cancer Maternal Cousin 107 - 20       unknown type   Breast cancer Neg Hx     ALLERGIES:  has no known allergies.  MEDICATIONS:  Current Outpatient Medications  Medication Sig Dispense Refill   Albuterol  Sulfate (PROAIR  RESPICLICK) 108 (90 Base) MCG/ACT AEPB Inhale 2 puffs into the lungs every 4 (four) hours as needed. (Patient taking differently: Inhale 2 puffs into the lungs every 4 (four) hours as needed (wheezing/shortness of breath.).) 1 each 0   Cyanocobalamin (VITAMIN B-12 PO) Take 1 tablet by mouth in the morning.     fexofenadine (ALLEGRA) 180 MG tablet Take 180 mg by mouth in the morning.     levothyroxine (SYNTHROID) 125 MCG tablet Take 125 mcg by mouth daily before breakfast.     traMADol  (ULTRAM ) 50 MG tablet Take 1 tablet (50 mg total) by mouth every 6 (six) hours as needed for moderate pain (pain score 4-6) or severe pain (pain score 7-10). 25 tablet 0   Vitamin D -Vitamin K (D3 + K2 PO) Take 1 tablet by mouth in the morning.  No current facility-administered medications for this visit.   All other systems were reviewed with the patient and are negative.  PHYSICAL EXAMINATION: ECOG PERFORMANCE STATUS: 0 - Asymptomatic  Vitals:   06/08/24 1320  BP: 136/81  Pulse: 92  Resp: 14  Temp: 97.9 F (36.6 C)  SpO2: 97%   Filed Weights   06/08/24 1320  Weight: 161 lb 9.6 oz (73.3 kg)    GENERAL:alert, no distress and comfortable In the left breast lower outer quadrant,   LABORATORY DATA:  I have reviewed the data as listed Lab Results  Component Value Date   WBC 10.3 05/05/2024   HGB 14.7 05/05/2024   HCT 44.4 05/05/2024   MCV 80.0 05/05/2024   PLT 291 05/05/2024   Lab Results  Component Value Date   NA 139 05/05/2024   K 3.6 05/05/2024   CL 107 05/05/2024    CO2 25 05/05/2024    RADIOGRAPHIC STUDIES: I have personally reviewed the radiological reports and agreed with the findings in the report.  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 - Administer radiation therapy following surgery provided oncotype low. - Prescribe tamoxifen post-radiation for five years due to premenopausal status. - Discuss common side effects of tamoxifen, including menopausal symptoms, vaginal discharge,risk of DVT/PE, endometrial hyperplasia and endometrial cancer. - Highlight potential improvement in bone density with tamoxifen. - Monitor for side effects of tamoxifen, including heavy menstruation and signs of blood clots. - Connect to breast cancer support group for additional resources and support. -She will RTC to review oncotype results and any additional recommendations.  Time spent: 45 min  All questions were answered. The patient knows to call the clinic with any problems, questions or concerns.    Amber Stalls, MD 06/08/24

## 2024-06-10 ENCOUNTER — Encounter: Payer: Self-pay | Admitting: *Deleted

## 2024-06-10 NOTE — Progress Notes (Signed)
 Location of Breast Cancer:  Malignant Neoplasm of Upper-Outer Quadrant of Left Breast, Estrogen Receptor Positive  Histology per Pathology Report:    Receptor Status: ER(90%), PR (90%), Her2-neu (Negative ), Ki-67(5%)  Did patient present with symptoms (if so, please note symptoms) or was this found on screening mammography?:  Mammogram Screening  04/27/2024   Past/Anticipated interventions by surgeon, if any: 05/13/2024 [name], MD Breast Lumpectomy with Radioactive Seed and Sentinel Lymph Node Biopsy Past/Anticipated interventions by medical oncology, if any: Chemotherapy   Lymphedema issues, if any:  None    Pain issues, if any: Denies   SAFETY ISSUES: Prior radiation? None Pacemaker/ICD? None Loop Recorder? None Pain Pump?None Neurostimulators?None Dexcom?None Possible current pregnancy?N/A Is the patient on methotrexate? N/A  Current Complaints / other details:   None

## 2024-06-14 NOTE — Progress Notes (Signed)
 Radiation Oncology         [phone] ________________________________  Name: Kristina [name] MRN: [id number]  Date: 06/15/2024  DOB: Oct 17, 1970  Follow-Up Visit Note  Outpatient  CC: Jefferey Fitch, MD  Loretha Ash, MD  Diagnosis:   No diagnosis found. 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 She was last seen in the breast clinic on 05/05/24.    Since consultation date, she underwent genetic testing on 05/05/24 showing no pathogenetic mutations contributing to her recent diagnosis.    Patient opted to proceed with a left breast lumpectomy with with radioactive seed and sentinel lymph node biopsy on 05/13/24 under the care of Dr. [name]. Surgical pathology revealed: tumor size of 1.5 cm; histology of grade 2 invasive ductal carcinoma with abundant extracellular mucin  carcinoma; margin status to invasive disease of 1 mm, superior; margin status to in situ disease of not applicable; nodal status of all examined nodes are negative for malignancy;  ER status: 90%, positive, strong staining intensity ; PR status 90%, positive, strong staining intensity , Her2 status negative; Grade 2.Ki-67 5%   Oncotype DX was obtained on the final surgical sample and the recurrence score of 15 predicts a risk of recurrence outside the breast over the next 9 years of 4%, if the patient's only systemic therapy is an antiestrogen for 5 years.  It also predicts no significant benefit from chemotherapy.  Patient presented for a consultation with Dr. Loretha on 06/08/24 to discuss treatment options. They opted to proceed with tamoxifen post-radiation for five years due to premenopausal status. Further treatment option is contingent on reviewing  oncotype testing.     Symptomatically, the patient reports: ***        ALLERGIES:  has no known allergies.  Meds: Current Outpatient Medications  Medication Sig Dispense Refill   Albuterol  Sulfate (PROAIR  RESPICLICK) 108 (90 Base) MCG/ACT AEPB Inhale 2 puffs into the lungs every 4 (four) hours as needed. (Patient taking differently: Inhale 2 puffs into the lungs every 4 (four) hours as needed (wheezing/shortness of breath.).) 1 each 0   Cyanocobalamin (VITAMIN B-12 PO) Take 1 tablet by mouth in the morning.     fexofenadine (ALLEGRA) 180 MG tablet Take 180 mg by mouth in the morning.     levothyroxine (SYNTHROID) 125 MCG tablet Take 125 mcg by mouth daily before breakfast.     traMADol  (ULTRAM ) 50 MG tablet Take 1 tablet (50 mg total) by mouth every 6 (six) hours as needed for moderate pain (pain score 4-6) or severe pain (pain score 7-10). 25 tablet 0   Vitamin D -Vitamin K (D3 + K2 PO) Take 1 tablet by mouth in the morning.     No current facility-administered medications for this visit.    Physical Findings:  vitals were not taken for this visit. .     General: Alert and oriented, in no acute distress HEENT: Head is normocephalic. Extraocular movements are intact. Oropharynx is clear. Neck: Neck is supple, no palpable cervical or supraclavicular lymphadenopathy. Heart: Regular in rate and rhythm with no murmurs, rubs, or gallops. Chest: Clear to auscultation bilaterally, with no rhonchi, wheezes, or rales. Abdomen: Soft, nontender, nondistended, with no rigidity or guarding. Extremities: No cyanosis or edema. Lymphatics: see Neck  Exam Musculoskeletal: symmetric strength and muscle tone throughout. Neurologic: No obvious focalities. Speech is fluent.  Psychiatric: Judgment and insight are intact. Affect is appropriate. Breast exam reveals ***  Lab Findings: Lab Results  Component Value Date   WBC 10.3 05/05/2024   HGB 14.7 05/05/2024   HCT 44.4 05/05/2024   MCV 80.0  05/05/2024   PLT 291 05/05/2024    @LASTCHEMISTRY @  Radiographic Findings: No results found.  Impression/Plan: We discussed adjuvant radiotherapy today.  I recommend *** in order to ***.  I reviewed the logistics, benefits, risks, and potential side effects of this treatment in detail. Risks may include but not necessary be limited to acute and late injury tissue in the radiation fields such as skin irritation (change in color/pigmentation, itching, dryness, pain, peeling). She may experience fatigue. We also discussed possible risk of long term cosmetic changes or scar tissue. There is also a smaller risk for lung toxicity, ***cardiac toxicity, ***brachial plexopathy, ***lymphedema, ***musculoskeletal changes, ***rib fragility or ***induction of a second malignancy, ***late chronic non-healing soft tissue wound.    The patient asked good questions which I answered to her satisfaction. She is enthusiastic about proceeding with treatment. A consent form has been *** signed and placed in her chart.  A total of *** medically necessary complex treatment devices will be fabricated and supervised by me: *** fields with MLCs for custom blocks to protect heart, and lungs;  and, a Vac-lok. MORE COMPLEX DEVICES MAY BE MADE IN DOSIMETRY FOR FIELD IN FIELD BEAMS FOR DOSE HOMOGENEITY.  I have requested : 3D Simulation which is medically necessary to give adequate dose to at risk tissues while sparing lungs and heart.  I have requested a DVH of the following structures: lungs, heart, *** lumpectomy cavity.    The patient will receive *** Gy in *** fractions to the *** with *** fields.  This will be *** followed by a boost.  On date of service, in total, I spent *** minutes on this encounter. Patient was seen in person.  _____________________________________   Lauraine Golden, MD  This document serves as a record of services personally performed by Lauraine Golden, MD. It was created on her behalf by Reymundo Cartwright,  a trained medical scribe. The creation of this record is based on the scribe's personal observations and the provider's statements to them. This document has been checked and approved by the attending provider.

## 2024-06-15 ENCOUNTER — Ambulatory Visit
Admission: RE | Admit: 2024-06-15 | Discharge: 2024-06-15 | Disposition: A | Discharge status: Home / Self Care | Source: Ambulatory Visit | Attending: Radiation Oncology | Admitting: Radiation Oncology

## 2024-06-15 ENCOUNTER — Encounter: Payer: Self-pay | Admitting: Radiation Oncology

## 2024-06-15 DIAGNOSIS — C50412 Malignant neoplasm of upper-outer quadrant of left female breast: Secondary | ICD-10-CM

## 2024-06-15 HISTORY — DX: Malignant neoplasm of unspecified site of unspecified female breast: C50.919

## 2024-06-18 ENCOUNTER — Ambulatory Visit
Admission: RE | Admit: 2024-06-18 | Discharge: 2024-06-18 | Disposition: A | Discharge status: Home / Self Care | Source: Ambulatory Visit | Attending: Radiation Oncology | Admitting: Radiation Oncology

## 2024-06-18 DIAGNOSIS — C50412 Malignant neoplasm of upper-outer quadrant of left female breast: Secondary | ICD-10-CM | POA: Diagnosis present

## 2024-06-22 DIAGNOSIS — C50412 Malignant neoplasm of upper-outer quadrant of left female breast: Secondary | ICD-10-CM | POA: Diagnosis not present

## 2024-06-23 ENCOUNTER — Other Ambulatory Visit: Payer: Self-pay

## 2024-06-23 ENCOUNTER — Ambulatory Visit
Admission: RE | Admit: 2024-06-23 | Discharge: 2024-06-23 | Disposition: A | Discharge status: Home / Self Care | Source: Ambulatory Visit | Attending: Radiation Oncology | Admitting: Radiation Oncology

## 2024-06-23 DIAGNOSIS — C50412 Malignant neoplasm of upper-outer quadrant of left female breast: Secondary | ICD-10-CM | POA: Insufficient documentation

## 2024-06-23 LAB — RAD ONC ARIA SESSION SUMMARY
Course Elapsed Days: 0
Plan Fractions Treated to Date: 1
Plan Prescribed Dose Per Fraction: 2.67 Gy
Plan Total Fractions Prescribed: 15
Plan Total Prescribed Dose: 40.05 Gy
Reference Point Dosage Given to Date: 2.67 Gy
Reference Point Session Dosage Given: 2.67 Gy
Session Number: 1

## 2024-06-24 ENCOUNTER — Ambulatory Visit
Admission: RE | Admit: 2024-06-24 | Discharge: 2024-06-24 | Disposition: A | Discharge status: Home / Self Care | Source: Ambulatory Visit | Attending: Radiation Oncology | Admitting: Radiation Oncology

## 2024-06-24 ENCOUNTER — Encounter: Payer: Self-pay | Admitting: *Deleted

## 2024-06-24 ENCOUNTER — Other Ambulatory Visit: Payer: Self-pay

## 2024-06-24 DIAGNOSIS — C50412 Malignant neoplasm of upper-outer quadrant of left female breast: Secondary | ICD-10-CM | POA: Diagnosis not present

## 2024-06-24 LAB — RAD ONC ARIA SESSION SUMMARY
Course Elapsed Days: 1
Plan Fractions Treated to Date: 2
Plan Prescribed Dose Per Fraction: 2.67 Gy
Plan Total Fractions Prescribed: 15
Plan Total Prescribed Dose: 40.05 Gy
Reference Point Dosage Given to Date: 5.34 Gy
Reference Point Session Dosage Given: 2.67 Gy
Session Number: 2

## 2024-06-25 ENCOUNTER — Other Ambulatory Visit: Payer: Self-pay

## 2024-06-25 ENCOUNTER — Ambulatory Visit
Admission: RE | Admit: 2024-06-25 | Discharge: 2024-06-25 | Disposition: A | Discharge status: Home / Self Care | Source: Ambulatory Visit | Attending: Radiation Oncology | Admitting: Radiation Oncology

## 2024-06-25 DIAGNOSIS — C50412 Malignant neoplasm of upper-outer quadrant of left female breast: Secondary | ICD-10-CM | POA: Diagnosis not present

## 2024-06-25 LAB — RAD ONC ARIA SESSION SUMMARY
Course Elapsed Days: 2
Plan Fractions Treated to Date: 3
Plan Prescribed Dose Per Fraction: 2.67 Gy
Plan Total Fractions Prescribed: 15
Plan Total Prescribed Dose: 40.05 Gy
Reference Point Dosage Given to Date: 8.01 Gy
Reference Point Session Dosage Given: 2.67 Gy
Session Number: 3

## 2024-06-28 ENCOUNTER — Ambulatory Visit
Admission: RE | Admit: 2024-06-28 | Discharge: 2024-06-28 | Disposition: A | Source: Ambulatory Visit | Attending: Radiation Oncology

## 2024-06-28 ENCOUNTER — Other Ambulatory Visit: Payer: Self-pay

## 2024-06-28 ENCOUNTER — Ambulatory Visit
Admission: RE | Admit: 2024-06-28 | Discharge: 2024-06-28 | Disposition: A | Discharge status: Home / Self Care | Source: Ambulatory Visit | Attending: Radiation Oncology | Admitting: Radiation Oncology

## 2024-06-28 DIAGNOSIS — C50412 Malignant neoplasm of upper-outer quadrant of left female breast: Secondary | ICD-10-CM | POA: Diagnosis not present

## 2024-06-28 LAB — RAD ONC ARIA SESSION SUMMARY
Course Elapsed Days: 5
Plan Fractions Treated to Date: 4
Plan Prescribed Dose Per Fraction: 2.67 Gy
Plan Total Fractions Prescribed: 15
Plan Total Prescribed Dose: 40.05 Gy
Reference Point Dosage Given to Date: 10.68 Gy
Reference Point Session Dosage Given: 2.67 Gy
Session Number: 4

## 2024-06-29 ENCOUNTER — Other Ambulatory Visit: Payer: Self-pay

## 2024-06-29 ENCOUNTER — Ambulatory Visit
Admission: RE | Admit: 2024-06-29 | Discharge: 2024-06-29 | Disposition: A | Discharge status: Home / Self Care | Source: Ambulatory Visit | Attending: Radiation Oncology | Admitting: Radiation Oncology

## 2024-06-29 DIAGNOSIS — C50412 Malignant neoplasm of upper-outer quadrant of left female breast: Secondary | ICD-10-CM | POA: Diagnosis not present

## 2024-06-29 LAB — RAD ONC ARIA SESSION SUMMARY
Course Elapsed Days: 6
Plan Fractions Treated to Date: 5
Plan Prescribed Dose Per Fraction: 2.67 Gy
Plan Total Fractions Prescribed: 15
Plan Total Prescribed Dose: 40.05 Gy
Reference Point Dosage Given to Date: 13.35 Gy
Reference Point Session Dosage Given: 2.67 Gy
Session Number: 5

## 2024-06-30 ENCOUNTER — Ambulatory Visit
Admission: RE | Admit: 2024-06-30 | Discharge: 2024-06-30 | Disposition: A | Source: Ambulatory Visit | Attending: Radiation Oncology

## 2024-06-30 ENCOUNTER — Other Ambulatory Visit: Payer: Self-pay

## 2024-06-30 DIAGNOSIS — C50412 Malignant neoplasm of upper-outer quadrant of left female breast: Secondary | ICD-10-CM | POA: Diagnosis not present

## 2024-06-30 LAB — RAD ONC ARIA SESSION SUMMARY
Course Elapsed Days: 7
Plan Fractions Treated to Date: 6
Plan Prescribed Dose Per Fraction: 2.67 Gy
Plan Total Fractions Prescribed: 15
Plan Total Prescribed Dose: 40.05 Gy
Reference Point Dosage Given to Date: 16.02 Gy
Reference Point Session Dosage Given: 2.67 Gy
Session Number: 6

## 2024-07-01 ENCOUNTER — Ambulatory Visit
Admission: RE | Admit: 2024-07-01 | Discharge: 2024-07-01 | Disposition: A | Source: Ambulatory Visit | Attending: Radiation Oncology

## 2024-07-01 ENCOUNTER — Other Ambulatory Visit: Payer: Self-pay

## 2024-07-01 DIAGNOSIS — C50412 Malignant neoplasm of upper-outer quadrant of left female breast: Secondary | ICD-10-CM | POA: Diagnosis not present

## 2024-07-01 LAB — RAD ONC ARIA SESSION SUMMARY
Course Elapsed Days: 8
Plan Fractions Treated to Date: 7
Plan Prescribed Dose Per Fraction: 2.67 Gy
Plan Total Fractions Prescribed: 15
Plan Total Prescribed Dose: 40.05 Gy
Reference Point Dosage Given to Date: 18.69 Gy
Reference Point Session Dosage Given: 2.67 Gy
Session Number: 7

## 2024-07-02 ENCOUNTER — Other Ambulatory Visit: Payer: Self-pay

## 2024-07-02 ENCOUNTER — Ambulatory Visit
Admission: RE | Admit: 2024-07-02 | Discharge: 2024-07-02 | Disposition: A | Discharge status: Home / Self Care | Source: Ambulatory Visit | Attending: Radiation Oncology | Admitting: Radiation Oncology

## 2024-07-02 DIAGNOSIS — C50412 Malignant neoplasm of upper-outer quadrant of left female breast: Secondary | ICD-10-CM | POA: Diagnosis not present

## 2024-07-02 LAB — RAD ONC ARIA SESSION SUMMARY
Course Elapsed Days: 9
Plan Fractions Treated to Date: 8
Plan Prescribed Dose Per Fraction: 2.67 Gy
Plan Total Fractions Prescribed: 15
Plan Total Prescribed Dose: 40.05 Gy
Reference Point Dosage Given to Date: 21.36 Gy
Reference Point Session Dosage Given: 2.67 Gy
Session Number: 8

## 2024-07-05 ENCOUNTER — Ambulatory Visit
Admission: RE | Admit: 2024-07-05 | Discharge: 2024-07-05 | Disposition: A | Source: Ambulatory Visit | Attending: Radiation Oncology

## 2024-07-05 ENCOUNTER — Other Ambulatory Visit: Payer: Self-pay

## 2024-07-05 DIAGNOSIS — C50412 Malignant neoplasm of upper-outer quadrant of left female breast: Secondary | ICD-10-CM | POA: Diagnosis not present

## 2024-07-05 LAB — RAD ONC ARIA SESSION SUMMARY
Course Elapsed Days: 12
Plan Fractions Treated to Date: 9
Plan Prescribed Dose Per Fraction: 2.67 Gy
Plan Total Fractions Prescribed: 15
Plan Total Prescribed Dose: 40.05 Gy
Reference Point Dosage Given to Date: 24.03 Gy
Reference Point Session Dosage Given: 2.67 Gy
Session Number: 9

## 2024-07-06 ENCOUNTER — Other Ambulatory Visit: Payer: Self-pay

## 2024-07-06 ENCOUNTER — Ambulatory Visit
Admission: RE | Admit: 2024-07-06 | Discharge: 2024-07-06 | Disposition: A | Source: Ambulatory Visit | Attending: Radiation Oncology

## 2024-07-06 DIAGNOSIS — C50412 Malignant neoplasm of upper-outer quadrant of left female breast: Secondary | ICD-10-CM | POA: Diagnosis not present

## 2024-07-06 LAB — RAD ONC ARIA SESSION SUMMARY
Course Elapsed Days: 13
Plan Fractions Treated to Date: 10
Plan Prescribed Dose Per Fraction: 2.67 Gy
Plan Total Fractions Prescribed: 15
Plan Total Prescribed Dose: 40.05 Gy
Reference Point Dosage Given to Date: 26.7 Gy
Reference Point Session Dosage Given: 2.67 Gy
Session Number: 10

## 2024-07-07 ENCOUNTER — Ambulatory Visit
Admission: RE | Admit: 2024-07-07 | Discharge: 2024-07-07 | Disposition: A | Discharge status: Home / Self Care | Source: Ambulatory Visit | Attending: Radiation Oncology | Admitting: Radiation Oncology

## 2024-07-07 ENCOUNTER — Other Ambulatory Visit: Payer: Self-pay

## 2024-07-07 DIAGNOSIS — C50412 Malignant neoplasm of upper-outer quadrant of left female breast: Secondary | ICD-10-CM | POA: Diagnosis not present

## 2024-07-07 LAB — RAD ONC ARIA SESSION SUMMARY
Course Elapsed Days: 14
Plan Fractions Treated to Date: 11
Plan Prescribed Dose Per Fraction: 2.67 Gy
Plan Total Fractions Prescribed: 15
Plan Total Prescribed Dose: 40.05 Gy
Reference Point Dosage Given to Date: 29.37 Gy
Reference Point Session Dosage Given: 2.67 Gy
Session Number: 11

## 2024-07-08 ENCOUNTER — Other Ambulatory Visit: Payer: Self-pay

## 2024-07-08 ENCOUNTER — Ambulatory Visit
Admission: RE | Admit: 2024-07-08 | Discharge: 2024-07-08 | Disposition: A | Discharge status: Home / Self Care | Source: Ambulatory Visit | Attending: Radiation Oncology | Admitting: Radiation Oncology

## 2024-07-08 DIAGNOSIS — C50412 Malignant neoplasm of upper-outer quadrant of left female breast: Secondary | ICD-10-CM | POA: Diagnosis not present

## 2024-07-08 LAB — RAD ONC ARIA SESSION SUMMARY
Course Elapsed Days: 15
Plan Fractions Treated to Date: 12
Plan Prescribed Dose Per Fraction: 2.67 Gy
Plan Total Fractions Prescribed: 15
Plan Total Prescribed Dose: 40.05 Gy
Reference Point Dosage Given to Date: 32.04 Gy
Reference Point Session Dosage Given: 2.67 Gy
Session Number: 12

## 2024-07-09 ENCOUNTER — Ambulatory Visit
Admission: RE | Admit: 2024-07-09 | Discharge: 2024-07-09 | Disposition: A | Discharge status: Home / Self Care | Source: Ambulatory Visit | Attending: Radiation Oncology | Admitting: Radiation Oncology

## 2024-07-09 ENCOUNTER — Other Ambulatory Visit: Payer: Self-pay

## 2024-07-09 DIAGNOSIS — C50412 Malignant neoplasm of upper-outer quadrant of left female breast: Secondary | ICD-10-CM | POA: Diagnosis not present

## 2024-07-09 LAB — RAD ONC ARIA SESSION SUMMARY
Course Elapsed Days: 16
Plan Fractions Treated to Date: 13
Plan Prescribed Dose Per Fraction: 2.67 Gy
Plan Total Fractions Prescribed: 15
Plan Total Prescribed Dose: 40.05 Gy
Reference Point Dosage Given to Date: 34.71 Gy
Reference Point Session Dosage Given: 2.67 Gy
Session Number: 13

## 2024-07-12 ENCOUNTER — Ambulatory Visit: Admitting: Radiation Oncology

## 2024-07-12 ENCOUNTER — Ambulatory Visit
Admission: RE | Admit: 2024-07-12 | Discharge: 2024-07-12 | Disposition: A | Discharge status: Home / Self Care | Source: Ambulatory Visit | Attending: Radiation Oncology | Admitting: Radiation Oncology

## 2024-07-12 ENCOUNTER — Other Ambulatory Visit: Payer: Self-pay

## 2024-07-12 DIAGNOSIS — C50412 Malignant neoplasm of upper-outer quadrant of left female breast: Secondary | ICD-10-CM | POA: Diagnosis not present

## 2024-07-12 LAB — RAD ONC ARIA SESSION SUMMARY
Course Elapsed Days: 19
Plan Fractions Treated to Date: 14
Plan Prescribed Dose Per Fraction: 2.67 Gy
Plan Total Fractions Prescribed: 15
Plan Total Prescribed Dose: 40.05 Gy
Reference Point Dosage Given to Date: 37.38 Gy
Reference Point Session Dosage Given: 2.67 Gy
Session Number: 14

## 2024-07-13 ENCOUNTER — Ambulatory Visit
Admission: RE | Admit: 2024-07-13 | Discharge: 2024-07-13 | Disposition: A | Source: Ambulatory Visit | Attending: Radiation Oncology

## 2024-07-13 ENCOUNTER — Other Ambulatory Visit: Payer: Self-pay

## 2024-07-13 DIAGNOSIS — C50412 Malignant neoplasm of upper-outer quadrant of left female breast: Secondary | ICD-10-CM | POA: Diagnosis not present

## 2024-07-13 LAB — RAD ONC ARIA SESSION SUMMARY
Course Elapsed Days: 20
Plan Fractions Treated to Date: 15
Plan Prescribed Dose Per Fraction: 2.67 Gy
Plan Total Fractions Prescribed: 15
Plan Total Prescribed Dose: 40.05 Gy
Reference Point Dosage Given to Date: 40.05 Gy
Reference Point Session Dosage Given: 2.67 Gy
Session Number: 15

## 2024-07-14 ENCOUNTER — Other Ambulatory Visit: Payer: Self-pay

## 2024-07-14 ENCOUNTER — Ambulatory Visit
Admission: RE | Admit: 2024-07-14 | Discharge: 2024-07-14 | Disposition: A | Source: Ambulatory Visit | Attending: Radiation Oncology

## 2024-07-14 DIAGNOSIS — C50412 Malignant neoplasm of upper-outer quadrant of left female breast: Secondary | ICD-10-CM | POA: Diagnosis not present

## 2024-07-14 LAB — RAD ONC ARIA SESSION SUMMARY
Course Elapsed Days: 21
Plan Fractions Treated to Date: 1
Plan Prescribed Dose Per Fraction: 2 Gy
Plan Total Fractions Prescribed: 5
Plan Total Prescribed Dose: 10 Gy
Reference Point Dosage Given to Date: 2 Gy
Reference Point Session Dosage Given: 2 Gy
Session Number: 16

## 2024-07-15 ENCOUNTER — Other Ambulatory Visit: Payer: Self-pay

## 2024-07-15 ENCOUNTER — Ambulatory Visit
Admission: RE | Admit: 2024-07-15 | Discharge: 2024-07-15 | Disposition: A | Discharge status: Home / Self Care | Source: Ambulatory Visit | Attending: Radiation Oncology | Admitting: Radiation Oncology

## 2024-07-15 DIAGNOSIS — C50412 Malignant neoplasm of upper-outer quadrant of left female breast: Secondary | ICD-10-CM | POA: Diagnosis not present

## 2024-07-15 LAB — RAD ONC ARIA SESSION SUMMARY
Course Elapsed Days: 22
Plan Fractions Treated to Date: 2
Plan Prescribed Dose Per Fraction: 2 Gy
Plan Total Fractions Prescribed: 5
Plan Total Prescribed Dose: 10 Gy
Reference Point Dosage Given to Date: 4 Gy
Reference Point Session Dosage Given: 2 Gy
Session Number: 17

## 2024-07-16 ENCOUNTER — Ambulatory Visit
Admission: RE | Admit: 2024-07-16 | Discharge: 2024-07-16 | Disposition: A | Discharge status: Home / Self Care | Source: Ambulatory Visit | Attending: Radiation Oncology | Admitting: Radiation Oncology

## 2024-07-16 ENCOUNTER — Other Ambulatory Visit: Payer: Self-pay

## 2024-07-16 DIAGNOSIS — C50412 Malignant neoplasm of upper-outer quadrant of left female breast: Secondary | ICD-10-CM | POA: Diagnosis not present

## 2024-07-16 LAB — RAD ONC ARIA SESSION SUMMARY
Course Elapsed Days: 23
Plan Fractions Treated to Date: 3
Plan Prescribed Dose Per Fraction: 2 Gy
Plan Total Fractions Prescribed: 5
Plan Total Prescribed Dose: 10 Gy
Reference Point Dosage Given to Date: 6 Gy
Reference Point Session Dosage Given: 2 Gy
Session Number: 18

## 2024-07-19 ENCOUNTER — Ambulatory Visit
Admission: RE | Admit: 2024-07-19 | Discharge: 2024-07-19 | Disposition: A | Source: Ambulatory Visit | Attending: Radiation Oncology

## 2024-07-19 ENCOUNTER — Ambulatory Visit
Admission: RE | Admit: 2024-07-19 | Discharge: 2024-07-19 | Disposition: A | Discharge status: Home / Self Care | Source: Ambulatory Visit | Attending: Radiation Oncology | Admitting: Radiation Oncology

## 2024-07-19 ENCOUNTER — Other Ambulatory Visit: Payer: Self-pay

## 2024-07-19 DIAGNOSIS — C50412 Malignant neoplasm of upper-outer quadrant of left female breast: Secondary | ICD-10-CM | POA: Diagnosis not present

## 2024-07-19 LAB — RAD ONC ARIA SESSION SUMMARY
Course Elapsed Days: 26
Plan Fractions Treated to Date: 4
Plan Prescribed Dose Per Fraction: 2 Gy
Plan Total Fractions Prescribed: 5
Plan Total Prescribed Dose: 10 Gy
Reference Point Dosage Given to Date: 8 Gy
Reference Point Session Dosage Given: 2 Gy
Session Number: 19

## 2024-07-20 ENCOUNTER — Ambulatory Visit
Admission: RE | Admit: 2024-07-20 | Discharge: 2024-07-20 | Disposition: A | Discharge status: Home / Self Care | Source: Ambulatory Visit | Attending: Radiation Oncology | Admitting: Radiation Oncology

## 2024-07-20 ENCOUNTER — Other Ambulatory Visit: Payer: Self-pay

## 2024-07-20 DIAGNOSIS — C50412 Malignant neoplasm of upper-outer quadrant of left female breast: Secondary | ICD-10-CM | POA: Diagnosis not present

## 2024-07-20 LAB — RAD ONC ARIA SESSION SUMMARY
Course Elapsed Days: 27
Plan Fractions Treated to Date: 5
Plan Prescribed Dose Per Fraction: 2 Gy
Plan Total Fractions Prescribed: 5
Plan Total Prescribed Dose: 10 Gy
Reference Point Dosage Given to Date: 10 Gy
Reference Point Session Dosage Given: 2 Gy
Session Number: 20

## 2024-07-21 NOTE — Radiation Completion Notes (Signed)
 Patient Name: Kristina [name], Kristina [name] MRN: [id number] Date of Birth: 18-Feb-1971 Referring Physician: AMBER STALLS, M.D. Date of Service: 2024-07-21 Radiation Oncologist: Lauraine Golden, M.D. [facility]                             RADIATION ONCOLOGY END OF TREATMENT NOTE     Diagnosis: C50.412 Malignant neoplasm of upper-outer quadrant of left female breast Staging on 2024-05-05: Malignant neoplasm of upper-outer quadrant of left breast in female, estrogen receptor positive (HCC) T=cT1c, N=cN0, M=cM0 Intent: Curative     ==========DELIVERED PLANS==========  First Treatment Date: 2024-06-23 Last Treatment Date: 2024-07-20   Plan Name: Breast_L_BH Site: Breast, Left Technique: 3D Mode: Photon Dose Per Fraction: 2.67 Gy Prescribed Dose (Delivered / Prescribed): 40.05 Gy / 40.05 Gy Prescribed Fxs (Delivered / Prescribed): 15 / 15   Plan Name: Brst_L_Bst_BH Site: Breast, Left Technique: 3D Mode: Photon Dose Per Fraction: 2 Gy Prescribed Dose (Delivered / Prescribed): 10 Gy / 10 Gy Prescribed Fxs (Delivered / Prescribed): 5 / 5     ==========ON TREATMENT VISIT DATES========== 2024-06-28, 2024-07-05, 2024-07-12, 2024-07-19     ==========UPCOMING VISITS========== 08/26/2024 [facility]-RADIATION ONC FOLLOW UP 20 Golden Lauraine, MD  08/16/2024 [facility] AT [facility] SCREEN Aden Berwyn LABOR, PTA  08/03/2024 [facility]-MED ONCOLOGY EST PT 15 Iruku, Amber, MD        ==========APPENDIX - ON TREATMENT VISIT NOTES==========   See weekly On Treatment Notes in Epic for details in the Media tab (listed as Progress notes on the On Treatment Visit Dates listed above).

## 2024-07-22 ENCOUNTER — Telehealth: Payer: Self-pay | Admitting: Adult Health

## 2024-07-22 NOTE — Telephone Encounter (Signed)
 Called the patient and spoke with her and is aware of her appt.

## 2024-08-02 ENCOUNTER — Telehealth: Payer: Self-pay

## 2024-08-02 NOTE — Telephone Encounter (Signed)
 Left message on voicemail about upcoming appointment on 11/11

## 2024-08-03 ENCOUNTER — Inpatient Hospital Stay: Attending: Hematology and Oncology | Admitting: Hematology and Oncology

## 2024-08-03 VITALS — BP 133/62 | HR 70 | Temp 97.8°F | Resp 18 | Wt 161.8 lb

## 2024-08-03 DIAGNOSIS — Z170 Estrogen receptor positive status [ER+]: Secondary | ICD-10-CM | POA: Diagnosis not present

## 2024-08-03 DIAGNOSIS — C50412 Malignant neoplasm of upper-outer quadrant of left female breast: Secondary | ICD-10-CM | POA: Diagnosis not present

## 2024-08-03 MED ORDER — TAMOXIFEN CITRATE 20 MG PO TABS: 20.0000 mg | ORAL_TABLET | Freq: Every day | ORAL | 3 refills | Status: AC

## 2024-08-03 NOTE — Progress Notes (Signed)
 [facility] CONSULT NOTE  Patient Care Team: Jefferey Fitch, MD as PCP - General (Internal Medicine) Tyree Nanetta SAILOR, RN as Oncology Nurse Navigator Gerome, Devere HERO, RN as Oncology Nurse Navigator [name] Berg, MD as Consulting Physician (General Surgery) Loretha Ash, MD as Consulting Physician (Hematology and Oncology) Izell Domino, MD as Attending Physician (Radiation Oncology)  CHIEF COMPLAINTS/PURPOSE OF CONSULTATION:  Newly diagnosed breast cancer  HISTORY OF PRESENTING ILLNESS:  Kristina [name] 53 y.o. female is here because of recent diagnosis of left breast cancer  Discussed the use of AI scribe software for clinical note transcription with the patient, who gave verbal consent to proceed.  History of Present Illness  Kristina [name] is a 53 year old female who presents for follow-up after completing radiation therapy for breast cancer.  She completed her radiation therapy for breast cancer on July 20, 2024, without any complications. She is currently premenopausal, having not had a menstrual period since August, with previous cycles occurring approximately every three months.  She is also considering MRD testing for surveillance.  She recently experienced the loss of her father, who passed away on the previous 12-Aug-2024 at the age of 86. He had a history of heart issues, including open-heart surgery in 08-13-03 and a heart pump, which he accidentally disrupted, leading to his passing. This event has been emotionally challenging for her.   SUMMARY OF ONCOLOGIC HISTORY: Oncology History  Malignant neoplasm of upper-outer quadrant of left breast in female, estrogen receptor positive (HCC)  05/03/2024 Initial Diagnosis   Malignant neoplasm of upper-outer quadrant of left breast in female, estrogen receptor positive (HCC)   05/05/2024 Cancer Staging   Staging form: Breast, AJCC 8th Edition - Clinical stage from 05/05/2024: Stage IA (cT1c, cN0, cM0, G2,  ER+, PR+, HER2-) - Signed by Loretha Ash, MD on 05/05/2024 Stage prefix: Initial diagnosis Histologic grading system: 3 grade system Laterality: Left Staged by: Pathologist and managing physician Stage used in treatment planning: Yes National guidelines used in treatment planning: Yes Type of national guideline used in treatment planning: NCCN    Genetic Testing   [facility] CancerNext-Expanded Panel+RNA was Negative. 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 Report date is 05/11/2024.   The CancerNext-Expanded gene panel offered by [facility] and includes sequencing, rearrangement, and RNA analysis for the following 77 genes: AIP, ALK, APC, ATM, AXIN2, BAP1, BARD1, BMPR1A, BRCA1, BRCA2, BRIP1, CDC73, CDH1, CDK4, CDKN1B, CDKN2A, CEBPA, CHEK2, CTNNA1, DDX41, DICER1, ETV6, FH, FLCN, GATA2, LZTR1, MAX, MBD4, MEN1, MET, MLH1, MSH2, MSH3, MSH6, MUTYH, NF1, NF2, NTHL1, PALB2, PHOX2B, PMS2, POT1, PRKAR1A, PTCH1, PTEN, RAD51C, RAD51D, RB1, RET, RPS20, RUNX1, SDHA, SDHAF2, SDHB, SDHC, SDHD, SMAD4, SMARCA4, SMARCB1, SMARCE1, STK11, SUFU, TMEM127, TP53, TSC1, TSC2, VHL, and WT1 (sequencing and deletion/duplication); EGFR, HOXB13, KIT, MITF, PDGFRA, POLD1, and POLE (sequencing only); EPCAM and GREM1 (deletion/duplication only).       MEDICAL HISTORY:  Past Medical History:  Diagnosis Date   Anxiety    Asthma    Breast cancer (HCC)    Complication of anesthesia    Depression    PONV (postoperative nausea and vomiting)    Thyroid disease     SURGICAL HISTORY: Past Surgical History:  Procedure Laterality Date   BREAST BIOPSY Left 04/27/2024   US  LT BREAST BX W LOC DEV 1ST LESION IMG BX SPEC US  GUIDE 04/27/2024 [facility]MO[facility]   BREAST BIOPSY Left 05/11/2024   US  LT RADIOACTIVE SEED LOC 05/11/2024 [facility] MAMMOGRAP[facility]  BREAST LUMPECTOMY WITH RADIOACTIVE SEED AND SENTINEL LYMPH NODE BIOPSY Left 05/13/2024   Procedure: BREAST LUMPECTOMY WITH RADIOACTIVE  SEED AND SENTINEL LYMPH NODE BIOPSY;  Surgeon: [name] Berg, MD;  Location: [facility] OR;  Service: General;  Laterality: Left;  RADIOACTIVE SEED GUIDED LEFT BREAST LUMPECTOMY   CHOLECYSTECTOMY  2009   ECTOPIC PREGNANCY SURGERY      SOCIAL HISTORY: Social History   Socioeconomic History   Marital status: Married    Spouse name: Not on file   Number of children: Not on file   Years of education: Not on file   Highest education level: Not on file  Occupational History   Not on file  Tobacco Use   Smoking status: Former   Smokeless tobacco: Never  Vaping Use   Vaping status: Never Used  Substance and Sexual Activity   Alcohol use: No   Drug use: No   Sexual activity: Not on file  Other Topics Concern   Not on file  Social History Narrative   Not on file   Social Drivers of Health   Financial Resource Strain: Not on file  Food Insecurity: No Food Insecurity (06/15/2024)   Hunger Vital Sign    Worried About Running Out of Food in the Last Year: Never true    Ran Out of Food in the Last Year: Never true  Transportation Needs: No Transportation Needs (06/15/2024)   PRAPARE - Administrator, Civil Service (Medical): No    Lack of Transportation (Non-Medical): No  Physical Activity: Not on file  Stress: Not on file  Social Connections: Not on file  Intimate Partner Violence: Not At Risk (06/15/2024)   Humiliation, Afraid, Rape, and Kick questionnaire    Fear of Current or Ex-Partner: No    Emotionally Abused: No    Physically Abused: No    Sexually Abused: No    FAMILY HISTORY: Family History  Problem Relation Age of Onset   Diabetes Father    Cancer Maternal Cousin 72 - 20       unknown type   Breast cancer Neg Hx     ALLERGIES:  has no known allergies.  MEDICATIONS:  Current Outpatient Medications  Medication Sig Dispense Refill   tamoxifen (NOLVADEX) 20 MG tablet Take 1 tablet (20 mg total) by mouth daily. 90 tablet 3   Albuterol  Sulfate (PROAIR   RESPICLICK) 108 (90 Base) MCG/ACT AEPB Inhale 2 puffs into the lungs every 4 (four) hours as needed. 1 each 0   Cyanocobalamin (VITAMIN B-12 PO) Take 1 tablet by mouth in the morning.     fexofenadine (ALLEGRA) 180 MG tablet Take 180 mg by mouth in the morning.     levothyroxine (SYNTHROID) 125 MCG tablet Take 125 mcg by mouth daily before breakfast.     montelukast  (SINGULAIR ) 10 MG tablet Take 10 mg by mouth in the morning.     traMADol  (ULTRAM ) 50 MG tablet Take 1 tablet (50 mg total) by mouth every 6 (six) hours as needed for moderate pain (pain score 4-6) or severe pain (pain score 7-10). (Patient not taking: Reported on 06/15/2024) 25 tablet 0   Vitamin D -Vitamin K (D3 + K2 PO) Take 1 tablet by mouth in the morning.     No current facility-administered medications for this visit.   All other systems were reviewed with the patient and are negative.  PHYSICAL EXAMINATION: ECOG PERFORMANCE STATUS: 0 - Asymptomatic  Vitals:   08/03/24 1048  BP: 133/62  Pulse: 70  Resp:  18  Temp: 97.8 F (36.6 C)  SpO2: 98%   Filed Weights   08/03/24 1048  Weight: 161 lb 12.8 oz (73.4 kg)    GENERAL:alert, no distress and comfortable   LABORATORY DATA:  I have reviewed the data as listed Lab Results  Component Value Date   WBC 10.3 05/05/2024   HGB 14.7 05/05/2024   HCT 44.4 05/05/2024   MCV 80.0 05/05/2024   PLT 291 05/05/2024   Lab Results  Component Value Date   NA 139 05/05/2024   K 3.6 05/05/2024   CL 107 05/05/2024   CO2 25 05/05/2024    RADIOGRAPHIC STUDIES: I have personally reviewed the radiological reports and agreed with the findings in the report.  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 All  lymph nodes were negative. Low oncotype score indicates no chemotherapy benefit. 4% risk of distant recurrence in nine years. Completed radiation therapy without complications.  - Given her pre menopausal status, recommended tamoxifen. We discussed mechanism of action of tamoxifen and adverse effects of tamoxifen including but not limited to post menopausal symptoms such as hot flashes, vaginal discharge, risk of DVT/PE, endometrial hyperplasia and endometrial carcinoma. - Schedule diagnostic mammogram for July. - Provided pamphlet on Guardant Reveal test. - Scheduled survivorship visit on February 5th with nurse practitioner. - Advised to contact clinic if experiencing significant side effects from tamoxifen.  Time spent: 30 min  All questions were answered. The patient knows to call the clinic with any problems, questions or concerns.    Amber Stalls, MD 08/03/24

## 2024-08-16 ENCOUNTER — Ambulatory Visit: Payer: Self-pay

## 2024-08-18 NOTE — Progress Notes (Signed)
 Kristina [name] is here today for follow up post radiation to the breast.   Breast Side:Left Breast   They completed their radiation on:  07/20/2024  Does the patient complain of any of the following: Post radiation skin issues: None Breast Tenderness: None Breast Swelling: None Lymphadema: None Range of Motion limitations: None Fatigue post radiation: Fatigue from radiation. Appetite good/fair/poor: Good  Additional comments if applicable: None Encouraged patient to use vitamin e oil and to call our office with any questions or concerns.

## 2024-08-23 ENCOUNTER — Encounter: Payer: Self-pay | Admitting: Hematology and Oncology

## 2024-08-26 ENCOUNTER — Ambulatory Visit
Admission: RE | Admit: 2024-08-26 | Discharge: 2024-08-26 | Disposition: A | Source: Ambulatory Visit | Attending: Radiation Oncology | Admitting: Radiation Oncology

## 2024-08-26 DIAGNOSIS — Z170 Estrogen receptor positive status [ER+]: Secondary | ICD-10-CM

## 2024-10-01 ENCOUNTER — Telehealth: Payer: Self-pay | Admitting: Adult Health

## 2024-10-01 NOTE — Telephone Encounter (Signed)
 I left voicemail regarding rescheduled SCP appointment from 10/28/2024 to 11/05/2024.

## 2024-10-28 ENCOUNTER — Inpatient Hospital Stay: Admitting: Adult Health

## 2024-11-05 ENCOUNTER — Inpatient Hospital Stay: Admitting: Adult Health

## 2025-05-03 ENCOUNTER — Inpatient Hospital Stay: Admitting: Hematology and Oncology
